# Patient Record
Sex: Female | Born: 1990 | Race: White | Hispanic: No | Marital: Married | Smoking: Never smoker
Health system: Southern US, Community
[De-identification: ages and names within clinical notes are randomized; demographics above are authoritative.]

## PROBLEM LIST (undated history)

## (undated) DIAGNOSIS — F419 Anxiety disorder, unspecified: Secondary | ICD-10-CM

## (undated) DIAGNOSIS — F329 Major depressive disorder, single episode, unspecified: Secondary | ICD-10-CM

## (undated) DIAGNOSIS — T7840XA Allergy, unspecified, initial encounter: Secondary | ICD-10-CM

## (undated) DIAGNOSIS — F32A Depression, unspecified: Secondary | ICD-10-CM

## (undated) HISTORY — DX: Depression, unspecified: F32.A

## (undated) HISTORY — PX: KNEE SURGERY: SHX244

## (undated) HISTORY — DX: Anxiety disorder, unspecified: F41.9

## (undated) HISTORY — DX: Allergy, unspecified, initial encounter: T78.40XA

---
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
## 2011-02-17 ENCOUNTER — Emergency Department (HOSPITAL_COMMUNITY)
Admission: EM | Admit: 2011-02-17 | Discharge: 2011-02-17 | Disposition: A | Discharge status: Home / Self Care | Payer: No Typology Code available for payment source | Attending: Emergency Medicine | Admitting: Emergency Medicine

## 2011-02-17 ENCOUNTER — Emergency Department (HOSPITAL_COMMUNITY): Payer: No Typology Code available for payment source

## 2011-02-17 DIAGNOSIS — Y998 Other external cause status: Secondary | ICD-10-CM | POA: Insufficient documentation

## 2011-02-17 DIAGNOSIS — Y92410 Unspecified street and highway as the place of occurrence of the external cause: Secondary | ICD-10-CM | POA: Insufficient documentation

## 2011-02-17 DIAGNOSIS — S335XXA Sprain of ligaments of lumbar spine, initial encounter: Secondary | ICD-10-CM | POA: Insufficient documentation

## 2011-02-17 DIAGNOSIS — S0990XA Unspecified injury of head, initial encounter: Secondary | ICD-10-CM | POA: Insufficient documentation

## 2011-02-17 MED ORDER — IOHEXOL 300 MG/ML  SOLN
100.0000 mL | Freq: Once | INTRAMUSCULAR | Status: AC | PRN
  Administered 2011-02-17: 100 mL via INTRAVENOUS

## 2011-08-19 ENCOUNTER — Ambulatory Visit (INDEPENDENT_AMBULATORY_CARE_PROVIDER_SITE_OTHER): Payer: BC Managed Care – PPO | Admitting: Physician Assistant

## 2011-08-19 VITALS — BP 115/72 | HR 76 | Temp 97.9°F | Resp 18 | Ht 61.0 in | Wt 139.0 lb

## 2011-08-19 DIAGNOSIS — J301 Allergic rhinitis due to pollen: Secondary | ICD-10-CM

## 2011-08-19 DIAGNOSIS — R05 Cough: Secondary | ICD-10-CM

## 2011-08-19 DIAGNOSIS — J029 Acute pharyngitis, unspecified: Secondary | ICD-10-CM

## 2011-08-19 DIAGNOSIS — J40 Bronchitis, not specified as acute or chronic: Secondary | ICD-10-CM

## 2011-08-19 LAB — POCT RAPID STREP A (OFFICE): Rapid Strep A Screen: NEGATIVE

## 2011-08-19 MED ORDER — FLUTICASONE PROPIONATE 50 MCG/ACT NA SUSP: 2.0000 | Freq: Every day | NASAL | Status: DC

## 2011-08-19 MED ORDER — HYDROCODONE-HOMATROPINE 5-1.5 MG/5ML PO SYRP: ORAL_SOLUTION | ORAL | Status: AC

## 2011-08-19 MED ORDER — IPRATROPIUM BROMIDE 0.06 % NA SOLN: 2.0000 | Freq: Three times a day (TID) | NASAL | Status: DC

## 2011-08-19 MED ORDER — AZITHROMYCIN 250 MG PO TABS: ORAL_TABLET | ORAL | Status: AC

## 2011-08-19 NOTE — Progress Notes (Signed)
Patient ID: Breona Cherubin MRN: [id number], DOB: [birth year], 21 y.o. Date of Encounter: 08/19/2011, 3:10 PM  Primary Physician: No primary provider on file.  Chief Complaint:  Chief Complaint  Patient presents with  . Headache    x 2 days  . Nasal Congestion    x 2 days  . Sore Throat    x 2 days    HPI: 21 y.o. year old female presents with a 4 day history of nasal congestion, post nasal drip, sore throat, sinus pressure, cough, rhinorrhea, and sneezing. Subjective fever. No chills. Nasal congestion thick and green/yellow. Cough is productive of green/yellow sputum and worse in the morning and at night when she lays down. Ears feel full, leading to sensation of muffled hearing. Has tried Allegra without success. No GI complaints. Appetite normal. She typically develops allergies, but has never had them be this bad before.   No sick contacts, recent antibiotics, or recent travels.   No leg trauma, sedentary periods, h/o cancer, or tobacco use.  Past Medical History  Diagnosis Date  . Allergy      Home Meds: Prior to Admission medications   Medication Sig Start Date End Date Taking? Authorizing Provider  fexofenadine-pseudoephedrine (ALLEGRA-D 24) 180-240 MG per 24 hr tablet Take 1 tablet by mouth daily.   Yes Historical Provider, MD    Allergies: No Known Allergies  History   Social History  . Marital Status: Single    Spouse Name: N/A    Number of Children: N/A  . Years of Education: N/A   Occupational History  . Not on file.   Social History Main Topics  . Smoking status: Never Smoker   . Smokeless tobacco: Not on file  . Alcohol Use: Not on file  . Drug Use: Not on file  . Sexually Active: Not on file   Other Topics Concern  . Not on file   Social History Narrative  . No narrative on file     Review of Systems: Constitutional: negative for chills, night sweats or weight changes Cardiovascular: negative for chest pain or  palpitations Respiratory: negative for hemoptysis, wheezing, or shortness of breath Abdominal: negative for abdominal pain, nausea, vomiting or diarrhea Dermatological: negative for rash Neurologic: negative for headache   Physical Exam: Blood pressure 115/72, pulse 76, temperature 97.9 F (36.6 C), temperature source Oral, resp. rate 18, height 5\' 1"  (1.549 m), weight 139 lb (63.05 kg), last menstrual period 07/26/2011., Body mass index is 26.26 kg/(m^2). General: Well developed, well nourished, in no acute distress. Head: Normocephalic, atraumatic, eyes without discharge, sclera non-icteric, nares are congested. Bilateral auditory canals clear, TM's are without perforation, pearly grey with reflective cone of light bilaterally. Serous effusion bilaterally behind TM's. Frontal sinus TTP. Oral cavity moist, dentition normal. Posterior pharynx with post nasal drip and mild erythema. No peritonsillar abscess or tonsillar exudate. Neck: Supple. No thyromegaly. Full ROM. No lymphadenopathy. Lungs: Clear bilaterally to auscultation without wheezes, rales, or rhonchi. Breathing is unlabored.  Heart: RRR with S1 S2. No murmurs, rubs, or gallops appreciated. Msk:  Strength and tone normal for age. Extremities: No clubbing or cyanosis. No edema. Neuro: Alert and oriented X 3. Moves all extremities spontaneously. CNII-XII grossly in tact. Psych:  Responds to questions appropriately with a normal affect.   Labs: Results for orders placed in visit on 08/19/11  POCT RAPID STREP A (OFFICE)      Component Value Range   Rapid Strep A Screen Negative  Negative  TC pending  ASSESSMENT AND PLAN:  21 y.o. year old female with allergic rhinitis and early bronchitis -Azithromycin 250 MG #6 2 po first day then 1 po next 4 days no RF, take if no better in 3 days -Atrovent NS 0.06% 2 sprays each nare bid prn #1 no RF -Flonase 2 sprays each nare daily #1 no RF -Hycodan #4oz 1 tsp po q 4-6 hours prn cough no  RF SED -TC results pending -Mucinex -Tylenol/Motrin prn -Rest/fluids -RTC precautions -RTC 3-5 days if no improvement  Signed, Eula Listen, PA-C 08/19/2011 3:10 PM

## 2011-08-21 LAB — CULTURE, GROUP A STREP: Organism ID, Bacteria: NORMAL

## 2012-02-22 ENCOUNTER — Ambulatory Visit (INDEPENDENT_AMBULATORY_CARE_PROVIDER_SITE_OTHER): Payer: BC Managed Care – PPO | Admitting: Family Medicine

## 2012-02-22 VITALS — BP 108/72 | HR 68 | Temp 98.2°F | Resp 16 | Ht 63.0 in | Wt 140.0 lb

## 2012-02-22 DIAGNOSIS — J029 Acute pharyngitis, unspecified: Secondary | ICD-10-CM

## 2012-02-22 LAB — POCT RAPID STREP A (OFFICE): Rapid Strep A Screen: NEGATIVE

## 2012-02-22 MED ORDER — PENICILLIN V POTASSIUM 500 MG PO TABS: 500.0000 mg | ORAL_TABLET | Freq: Two times a day (BID) | ORAL | Status: DC

## 2012-02-22 NOTE — Progress Notes (Signed)
  Subjective:    Patient ID: Carmen Keller, female    DOB: 1990/06/06, 21 y.o.   MRN: [id number]  HPI  Has a sore throat and it is traveling up to her left ear, hard for her to swallow and feels like food is getting stuck for the past 2d No f/c, has had an occ cough and a few sneezing. Has had night sweats.  Past Medical History  Diagnosis Date  . Allergy      Review of Systems  Constitutional: Positive for diaphoresis. Negative for fever, chills and fatigue.  HENT: Positive for sore throat, sneezing, trouble swallowing, neck pain and voice change. Negative for congestion, rhinorrhea, drooling and postnasal drip.   Eyes: Negative for discharge.  Respiratory: Positive for cough. Negative for shortness of breath.   Cardiovascular: Negative for chest pain.  Gastrointestinal: Negative for nausea and vomiting.  Skin: Negative for color change, pallor and rash.  Hematological: Positive for adenopathy.        BP 108/72  Pulse 68  Temp 98.2 F (36.8 C) (Oral)  Resp 16  Ht 5\' 3"  (1.6 m)  Wt 140 lb (63.504 kg)  BMI 24.80 kg/m2  SpO2 98%  LMP 01/30/2012  Objective:   Physical Exam  Constitutional: She is oriented to person, place, and time. She appears well-developed and well-nourished. No distress.  HENT:  Head: Normocephalic and atraumatic.  Right Ear: Tympanic membrane, external ear and ear canal normal.  Left Ear: Tympanic membrane, external ear and ear canal normal.  Nose: Rhinorrhea present.  Mouth/Throat: Uvula is midline and mucous membranes are normal. Oropharyngeal exudate, posterior oropharyngeal edema and posterior oropharyngeal erythema present. No tonsillar abscesses.  Eyes: Conjunctivae normal are normal. Right eye exhibits no discharge. Left eye exhibits no discharge. No scleral icterus.  Neck: Normal range of motion. Neck supple. No thyromegaly present.  Cardiovascular: Normal rate, regular rhythm and normal heart sounds.   Pulmonary/Chest: Effort normal and  breath sounds normal. No respiratory distress.  Lymphadenopathy:       Head (right side): Submandibular adenopathy present.       Head (left side): Submandibular adenopathy present.    She has cervical adenopathy.       Right cervical: Superficial cervical adenopathy present.       Left cervical: Superficial cervical adenopathy present.  Neurological: She is alert and oriented to person, place, and time.  Skin: Skin is warm and dry. She is not diaphoretic.  Psychiatric: She has a normal mood and affect. Her behavior is normal.      Results for orders placed in visit on 02/22/12  POCT RAPID STREP A (OFFICE)      Component Value Range   Rapid Strep A Screen Negative  Negative    Assessment & Plan:  1. Pharyngitis - Due to hx and current exam, will go ahead and cover w/ penicillin.   clx pending.  If positive - pt may want to go back to ENT to consider tonsillectomy.

## 2012-02-24 LAB — CULTURE, GROUP A STREP: Organism ID, Bacteria: NORMAL

## 2012-04-28 ENCOUNTER — Ambulatory Visit (INDEPENDENT_AMBULATORY_CARE_PROVIDER_SITE_OTHER): Payer: BC Managed Care – PPO | Admitting: Family Medicine

## 2012-04-28 VITALS — BP 112/72 | HR 64 | Temp 97.7°F | Resp 16 | Ht 63.0 in | Wt 142.0 lb

## 2012-04-28 DIAGNOSIS — N390 Urinary tract infection, site not specified: Secondary | ICD-10-CM

## 2012-04-28 DIAGNOSIS — R319 Hematuria, unspecified: Secondary | ICD-10-CM

## 2012-04-28 DIAGNOSIS — R300 Dysuria: Secondary | ICD-10-CM

## 2012-04-28 DIAGNOSIS — R339 Retention of urine, unspecified: Secondary | ICD-10-CM

## 2012-04-28 LAB — POCT UA - MICROSCOPIC ONLY
Casts, Ur, LPF, POC: NEGATIVE
Crystals, Ur, HPF, POC: NEGATIVE
Mucus, UA: POSITIVE
Yeast, UA: NEGATIVE

## 2012-04-28 LAB — POCT URINALYSIS DIPSTICK
Bilirubin, UA: NEGATIVE
Glucose, UA: NEGATIVE
Ketones, UA: NEGATIVE
Nitrite, UA: NEGATIVE
Protein, UA: 100
Spec Grav, UA: >=1.03
Urobilinogen, UA: 0.2
pH, UA: 6

## 2012-04-28 MED ORDER — PHENAZOPYRIDINE HCL 100 MG PO TABS: 100.0000 mg | ORAL_TABLET | Freq: Three times a day (TID) | ORAL | Status: DC | PRN

## 2012-04-28 MED ORDER — SULFAMETHOXAZOLE-TRIMETHOPRIM 800-160 MG PO TABS: 1.0000 | ORAL_TABLET | Freq: Two times a day (BID) | ORAL | Status: DC

## 2012-04-28 NOTE — Progress Notes (Signed)
Subjective: 21 year old female who has not had a lot of problems with UTIs in the past. For the last 3 days she's been having dysuria. Her urine was initially dark. This morning there is some blood in it. She's had a urgency sensation but not urinating much. Not febrile.  Objective: Afebrile. No CVA tenderness.  Results for orders placed in visit on 04/28/12  POCT URINALYSIS DIPSTICK      Component Value Range   Color, UA yellow     Clarity, UA cloudy     Glucose, UA neg     Bilirubin, UA neg     Ketones, UA neg     Spec Grav, UA >=1.030     Blood, UA large     pH, UA 6.0     Protein, UA 100     Urobilinogen, UA 0.2     Nitrite, UA neg     Leukocytes, UA moderate (2+)    POCT UA - MICROSCOPIC ONLY      Component Value Range   WBC, Ur, HPF, POC tntc     RBC, urine, microscopic tntc     Bacteria, U Microscopic 4+     Mucus, UA pos     Epithelial cells, urine per micros 4-6     Crystals, Ur, HPF, POC neg     Casts, Ur, LPF, POC neg     Yeast, UA neg     Assessment: UTI  Plan: Bactrim Peridium Return if further problems Cultures pending

## 2012-04-28 NOTE — Patient Instructions (Signed)
Urinary Tract Infection Urinary tract infections (UTIs) can develop anywhere along your urinary tract. Your urinary tract is your body's drainage system for removing wastes and extra water. Your urinary tract includes two kidneys, two ureters, a bladder, and a urethra. Your kidneys are a pair of bean-shaped organs. Each kidney is about the size of your fist. They are located below your ribs, one on each side of your spine. CAUSES Infections are caused by microbes, which are microscopic organisms, including fungi, viruses, and bacteria. These organisms are so small that they can only be seen through a microscope. Bacteria are the microbes that most commonly cause UTIs. SYMPTOMS  Symptoms of UTIs may vary by age and gender of the patient and by the location of the infection. Symptoms in young women typically include a frequent and intense urge to urinate and a painful, burning feeling in the bladder or urethra during urination. Older women and men are more likely to be tired, shaky, and weak and have muscle aches and abdominal pain. A fever may mean the infection is in your kidneys. Other symptoms of a kidney infection include pain in your back or sides below the ribs, nausea, and vomiting. DIAGNOSIS To diagnose a UTI, your caregiver will ask you about your symptoms. Your caregiver also will ask to provide a urine sample. The urine sample will be tested for bacteria and white blood cells. White blood cells are made by your body to help fight infection. TREATMENT  Typically, UTIs can be treated with medication. Because most UTIs are caused by a bacterial infection, they usually can be treated with the use of antibiotics. The choice of antibiotic and length of treatment depend on your symptoms and the type of bacteria causing your infection. HOME CARE INSTRUCTIONS  If you were prescribed antibiotics, take them exactly as your caregiver instructs you. Finish the medication even if you feel better after you  have only taken some of the medication.  Drink enough water and fluids to keep your urine clear or pale yellow.  Avoid caffeine, tea, and carbonated beverages. They tend to irritate your bladder.  Empty your bladder often. Avoid holding urine for long periods of time.  Empty your bladder before and after sexual intercourse.  After a bowel movement, women should cleanse from front to back. Use each tissue only once. SEEK MEDICAL CARE IF:   You have back pain.  You develop a fever.  Your symptoms do not begin to resolve within 3 days. SEEK IMMEDIATE MEDICAL CARE IF:   You have severe back pain or lower abdominal pain.  You develop chills.  You have nausea or vomiting.  You have continued burning or discomfort with urination. MAKE SURE YOU:   Understand these instructions.  Will watch your condition.  Will get help right away if you are not doing well or get worse. Document Released: 02/03/2005 Document Revised: 10/26/2011 Document Reviewed: 06/04/2011 [employer] Patient Information 2013 [employer].  

## 2012-05-01 LAB — URINE CULTURE: Colony Count: >=100000

## 2012-05-14 ENCOUNTER — Emergency Department (HOSPITAL_COMMUNITY)
Admission: EM | Admit: 2012-05-14 | Discharge: 2012-05-14 | Disposition: A | Discharge status: Home / Self Care | Payer: BC Managed Care – PPO | Attending: Emergency Medicine | Admitting: Emergency Medicine

## 2012-05-14 ENCOUNTER — Encounter (HOSPITAL_COMMUNITY): Payer: Self-pay | Admitting: Emergency Medicine

## 2012-05-14 DIAGNOSIS — Z3202 Encounter for pregnancy test, result negative: Secondary | ICD-10-CM | POA: Insufficient documentation

## 2012-05-14 DIAGNOSIS — R110 Nausea: Secondary | ICD-10-CM | POA: Insufficient documentation

## 2012-05-14 DIAGNOSIS — R350 Frequency of micturition: Secondary | ICD-10-CM | POA: Insufficient documentation

## 2012-05-14 DIAGNOSIS — N390 Urinary tract infection, site not specified: Secondary | ICD-10-CM | POA: Insufficient documentation

## 2012-05-14 DIAGNOSIS — R6883 Chills (without fever): Secondary | ICD-10-CM | POA: Insufficient documentation

## 2012-05-14 DIAGNOSIS — R319 Hematuria, unspecified: Secondary | ICD-10-CM | POA: Insufficient documentation

## 2012-05-14 LAB — URINE MICROSCOPIC-ADD ON

## 2012-05-14 LAB — URINALYSIS, ROUTINE W REFLEX MICROSCOPIC
Bilirubin Urine: NEGATIVE
Glucose, UA: NEGATIVE mg/dL
Hgb urine dipstick: NEGATIVE
Nitrite: POSITIVE — AB
Protein, ur: 30 mg/dL — AB
Specific Gravity, Urine: 1.029 (ref 1.005–1.030)
Urobilinogen, UA: 1 mg/dL (ref 0.0–1.0)
pH: 7 (ref 5.0–8.0)

## 2012-05-14 LAB — POCT PREGNANCY, URINE: Preg Test, Ur: NEGATIVE

## 2012-05-14 MED ORDER — NITROFURANTOIN MONOHYD MACRO 100 MG PO CAPS: 100.0000 mg | ORAL_CAPSULE | Freq: Two times a day (BID) | ORAL | Status: DC

## 2012-05-14 MED ORDER — IBUPROFEN 800 MG PO TABS
800.0000 mg | ORAL_TABLET | Freq: Once | ORAL | Status: AC
  Administered 2012-05-14: 800 mg via ORAL
  Filled 2012-05-14: qty 1

## 2012-05-14 NOTE — ED Provider Notes (Signed)
History     CSN: [id number]  Arrival date & time 05/14/12  2055   First MD Initiated Contact with Patient 05/14/12 2117      Chief Complaint  Patient presents with  . Dysuria   HPI  History provided by the patient. Patient is 22 year old female with no significant PMH who presents with complaints of dysuria, urinary frequency for the past week. Patient states she is sexually had some symptoms for the past 2 weeks or more. She was seen in urgent care and she states at that time was told that she had a urinary tract infection. She was given prescriptions for primary and which she has been taking but found out this was not an antibiotic and states that she continues to have symptoms. Symptoms have been associated with some chills and nausea. She denies any fever or vomiting.    Past Medical History  Diagnosis Date  . Allergy     Past Surgical History  Procedure Date  . Knee surgery     Family History  Problem Relation Age of Onset  . Diabetes Father   . Cancer Maternal Grandfather   . Diabetes Paternal Grandfather     History  Substance Use Topics  . Smoking status: Never Smoker   . Smokeless tobacco: Not on file  . Alcohol Use: No    OB History    Grav Para Term Preterm Abortions TAB SAB Ect Mult Living                  Review of Systems  Constitutional: Positive for chills. Negative for fever.  Respiratory: Negative for cough.   Gastrointestinal: Positive for nausea. Negative for vomiting and abdominal pain.  Genitourinary: Positive for dysuria, frequency and hematuria. Negative for flank pain.  All other systems reviewed and are negative.    Allergies  Review of patient's allergies indicates no known allergies.  Home Medications   Current Outpatient Rx  Name  Route  Sig  Dispense  Refill  . PHENAZOPYRIDINE HCL 100 MG PO TABS   Oral   Take 1 tablet (100 mg total) by mouth 3 (three) times daily as needed for pain.   10 tablet   0     BP 132/88   Pulse 83  Temp 98.5 F (36.9 C) (Oral)  Resp 20  SpO2 100%  LMP 05/12/2012  Physical Exam  Nursing note and vitals reviewed. Constitutional: She is oriented to person, place, and time. She appears well-developed and well-nourished. No distress.  HENT:  Head: Normocephalic.  Cardiovascular: Normal rate and regular rhythm.   Pulmonary/Chest: Effort normal and breath sounds normal. No respiratory distress. She has no wheezes. She has no rales.  Abdominal: Soft. She exhibits no distension. There is no guarding.       Mild diffuse tenderness. No significant CVA tenderness.  Neurological: She is alert and oriented to person, place, and time.  Skin: Skin is warm and dry. No rash noted.  Psychiatric: She has a normal mood and affect. Her behavior is normal.    ED Course  Procedures   Results for orders placed during the hospital encounter of 05/14/12  URINALYSIS, ROUTINE W REFLEX MICROSCOPIC      Component Value Range   Color, Urine ORANGE (*) YELLOW   APPearance CLOUDY (*) CLEAR   Specific Gravity, Urine 1.029  1.005 - 1.030   pH 7.0  5.0 - 8.0   Glucose, UA NEGATIVE  NEGATIVE mg/dL   Hgb urine dipstick NEGATIVE  NEGATIVE   Bilirubin Urine NEGATIVE  NEGATIVE   Ketones, ur TRACE (*) NEGATIVE mg/dL   Protein, ur 30 (*) NEGATIVE mg/dL   Urobilinogen, UA 1.0  0.0 - 1.0 mg/dL   Nitrite POSITIVE (*) NEGATIVE   Leukocytes, UA MODERATE (*) NEGATIVE  POCT PREGNANCY, URINE      Component Value Range   Preg Test, Ur NEGATIVE  NEGATIVE  URINE MICROSCOPIC-ADD ON      Component Value Range   Squamous Epithelial / LPF RARE  RARE   WBC, UA 7-10  <3 WBC/hpf   Bacteria, UA RARE  RARE   Urine-Other AMORPHOUS URATES/PHOSPHATES          1. UTI (lower urinary tract infection)       MDM  10:10PM patient seen and evaluated. Patient is well-appearing no acute distress. Patient does not appear severely ill or toxic       Angus Seller, [place] 05/14/12 2228

## 2012-05-14 NOTE — ED Notes (Signed)
Did not collect urine specimen in triage as pt is on her menstrual cycle

## 2012-05-14 NOTE — ED Notes (Signed)
Pt states she has sxs of UTI  Pt was seen at urgent care and was given pyridium but no antibiotics  Pt states she has been using azo  Pt states she is having frequency, painful urination, and states her stream is not like it should be it comes in spurts

## 2012-05-15 NOTE — ED Provider Notes (Signed)
Medical screening examination/treatment/procedure(s) were performed by non-physician practitioner and as supervising physician I was immediately available for consultation/collaboration.  Toy Baker, MD 05/15/12 [phone]

## 2012-11-06 ENCOUNTER — Ambulatory Visit (INDEPENDENT_AMBULATORY_CARE_PROVIDER_SITE_OTHER): Payer: BC Managed Care – PPO | Admitting: Emergency Medicine

## 2012-11-06 VITALS — BP 122/80 | HR 76 | Temp 98.5°F | Resp 16 | Ht 62.0 in | Wt 138.8 lb

## 2012-11-06 DIAGNOSIS — A09 Infectious gastroenteritis and colitis, unspecified: Secondary | ICD-10-CM

## 2012-11-06 LAB — COMPREHENSIVE METABOLIC PANEL
ALT: 123 U/L — ABNORMAL HIGH (ref 0–35)
AST: 68 U/L — ABNORMAL HIGH (ref 0–37)
Albumin: 4.6 g/dL (ref 3.5–5.2)
Alkaline Phosphatase: 74 U/L (ref 39–117)
BUN: 10 mg/dL (ref 6–23)
CO2: 27 mEq/L (ref 19–32)
Calcium: 10.3 mg/dL (ref 8.4–10.5)
Chloride: 103 mEq/L (ref 96–112)
Creat: 0.87 mg/dL (ref 0.50–1.10)
Glucose, Bld: 99 mg/dL (ref 70–99)
Potassium: 4.4 mEq/L (ref 3.5–5.3)
Sodium: 140 mEq/L (ref 135–145)
Total Bilirubin: 0.3 mg/dL (ref 0.3–1.2)
Total Protein: 7.6 g/dL (ref 6.0–8.3)

## 2012-11-06 MED ORDER — CIPROFLOXACIN HCL 750 MG PO TABS: 750.0000 mg | ORAL_TABLET | Freq: Two times a day (BID) | ORAL | Status: DC

## 2012-11-06 NOTE — Progress Notes (Signed)
[facility] [address] [place] [address] [phone]  Date:  11/06/2012   Name:  Carmen Keller   DOB:  [birth year]   MRN:  [id number]  PCP:  No primary provider on file.    Chief Complaint: nausea and vomiting and loss of appetitie   History of Present Illness:  Carmen Keller is a 22 y.o. very pleasant female patient who presents with the following:  Visited [place] on vacation and has experienced frequent diarrhea and nausea.  Now has clay colored stool.  No jaundice.  Has fever and chills. No rash.  No improvement with over the counter medications or other home remedies. Denies other complaint or health concern today.   There are no active problems to display for this patient.   Past Medical History  Diagnosis Date  . Allergy     Past Surgical History  Procedure Laterality Date  . Knee surgery      History  Substance Use Topics  . Smoking status: Never Smoker   . Smokeless tobacco: Not on file  . Alcohol Use: No    Family History  Problem Relation Age of Onset  . Diabetes Father   . Cancer Maternal Grandfather   . Diabetes Paternal Grandfather     No Known Allergies  Medication list has been reviewed and updated.  Current Outpatient Prescriptions on File Prior to Visit  Medication Sig Dispense Refill  . nitrofurantoin, macrocrystal-monohydrate, (MACROBID) 100 MG capsule Take 1 capsule (100 mg total) by mouth 2 (two) times daily. X 7 days  14 capsule  0  . phenazopyridine (PYRIDIUM) 100 MG tablet Take 1 tablet (100 mg total) by mouth 3 (three) times daily as needed for pain.  10 tablet  0   No current facility-administered medications on file prior to visit.    Review of Systems:  As per HPI, otherwise negative.    Physical Examination: Filed Vitals:   11/06/12 1345  BP: 122/80  Pulse: 76  Temp: 98.5 F (36.9 C)  Resp: 16   Filed Vitals:   11/06/12 1345  Height: 5\' 2"  (1.575 m)  Weight: 138 lb 12.8 oz  (62.959 kg)   Body mass index is 25.38 kg/(m^2). Ideal Body Weight: Weight in (lb) to have BMI = 25: 136.4  GEN: WDWN, NAD, Non-toxic, A & O x 3 HEENT: Atraumatic, Normocephalic. Neck supple. No masses, No LAD. Ears and Nose: No external deformity. CV: RRR, No M/G/R. No JVD. No thrill. No extra heart sounds. PULM: CTA B, no wheezes, crackles, rhonchi. No retractions. No resp. distress. No accessory muscle use. ABD: S, NT, ND, +BS. No rebound. No HSM. EXTR: No c/c/e NEURO Normal gait.  PSYCH: Normally interactive. Conversant. Not depressed or anxious appearing.  Calm demeanor.    Assessment and Plan: Travelers diarrhea Possible hepatitis Labs cipro   Signed,  Phillips Odor, MD

## 2012-11-06 NOTE — Patient Instructions (Addendum)
Travelers' Diarrhea Travelers' diarrhea (TD) is the most common illness affecting travelers. Each year many travelers develop diarrhea. TD usually occurs within the first week of travel. However, it may occur at any time while traveling. It may even occur after returning home. The most important risk factor is where you are going. High-risk places are the developing countries of:  [place].  [place].  [place]. High risk people include young adults and those with:  Transplants.  HIV infections.  Medicine that suppresses the immune system.  Inflammatory-bowel disease.  Diabetes.  H-2 blockers or antacids. Attack rates are similar for men and women. The primary source of TD is eating or drinking food or water tainted with feces (stool or bowel movements). CAUSES  Infectious agents are the primary cause of TD. Germs cause almost 80% of TD cases. The most common germ produces:  Watery diarrhea with cramps.  Low-grade or no fever. There are many other bacterial, viral and parasitic pathogens (disease causing "bugs").  SYMPTOMS  Most TD cases begin suddenly. Symptoms include stool that is increased in:  Frequency.  Volume.  Weight. Altered stool consistency also is common. Typically, you have four to five loose or watery bowel movements each day. Other common symptoms are:  Nausea.  Vomiting.  Diarrhea.  Abdominal cramping.  Bloating.  Fever.  Urgency.  Malaise. Most cases are not dangerous. Most cases go away in 1-2 days without treatment. TD is rarely life threatening. 90% of cases resolve within 1 week. 98% resolve within 1 month. PREVENTION   Avoid foods or beverages purchased from street vendors in high risk countries.  Avoid food from places where unclean conditions are present.  Avoid raw or undercooked meat and seafood.  Avoid raw fruits (e.g., oranges, bananas, avocados) and vegetables unless you peel them yourself.  If handled  properly, well-cooked and packaged foods usually are safe. Foods associated with increased risk for TD include:  Tap water.  Ice.  Unpasteurized milk.  Dairy products.  Safe beverages include:  Bottled carbonated beverages.  Hot tea or coffee.  Beer.  Wine.  Boiled water.  Water treated with iodine or chlorine. ANTIBIOTICS ARE NOT RECOMMENDED AS PREVENTION  CDC (Centers for Disease Control) does not recommend antimicrobial drugs (medicine that kill germs) to prevent TD. Several studies show that Pepto-Bismol taken as either 2 tablets 4 times daily, or 2 fluid ounces 4 times daily, reduces the incidence of travelers' diarrhea. People that should avoid Pepto-Bismol include those who are:  Pregnant.  Allergic to aspirin.  Taking anticoagulants medicine (probenecid, methotrexate).  Be informed about potential side effects, in particular about temporary blackening of the tongue and stool, and rarely ringing in the ears. Because of potential adverse side effects, preventative Pepto-Bismol should not be used for more than 3 weeks.  Some antibiotics taken in a once-a-day dose are 90% effective at preventing travelers' diarrhea. However, antibiotics are not recommended as prevention. Routine antimicrobial prophylaxis increases your risk for:  Adverse reactions.  Infections with resistant organisms.  Antibiotics can increase your susceptibility to resistant bacterial pathogens and provide no protection against either viral or parasitic pathogens. This can give travelers a false sense of security. As a result, strict adherence to preventive measures is encouraged. Pepto-Bismol should be used as an extra effort if prophylaxis is needed. TREATMENT   TD usually is a self-limited disorder. It gets well without treatment. It often goes away without specific treatment. Oral re-hydration is often helpful to replace lost fluids  and electrolytes. Clear liquids are routinely recommended  for adults. You may be helped with antimicrobial therapy if you develop three or more loose stools in an 8-hour period, especially if associated with:  Nausea.  Vomiting.  Abdominal cramps.  Fever.  Blood in stools.  Antibiotics usually are given for 3-5 days. Pepto-Bismol also may be used as treatment. Take one fluid ounce, or two 262 mg tablets every 30 minutes, for up to 8 doses in a 24-hour period. This can be repeated on a second day. If diarrhea persists despite therapy, you should be evaluated by a caregiver and treated for possible parasitic infection.  Because drug resistance is a continuing problem and may vary from country to country, professional assistance should be looked for if problems persist.  Antimotility agents (loperamide, diphenoxylate, and paregoric) mostly reduce diarrhea by slowing down the passage of food and drink in the gut. This allows more time for absorption. Some persons believe diarrhea is the body's defense mechanism to minimize contact time between gut pathogens and lining of the bowel. In several studies, antimotility agents have been useful in treating travelers' diarrhea by decreasing the duration of diarrhea. However, these agents should never be used by persons with fever or bloody diarrhea because they can increase the severity of disease by delaying clearance of causative organisms. Because antimotility agents are now available over the counter, their improper use is of concern. Complications have been reported from the use of these medicines such as:  Toxic megacolon.  Sepsis.  Disseminated intravascular coagulation. SEEK IMMEDIATE MEDICAL CARE IF:   You are unable to keep fluids down.  Vomiting or diarrhea becomes persistent.  Abdominal (belly) pain develops or increases or localizes. (Right sided pain can be appendicitis and left sided pain in adults can be diverticulitis).  You develop an oral temperature above 102 F (38.9 C), or as your  caregiver suggests.  Diarrhea becomes excessive or contains blood or mucous.  Excessive weakness, dizziness, fainting or extreme thirst.  Checking weight 2 to 3 times per day in babies and children will help verify adequate fluid replacement. Your caregiver will tell you what loss should concern you or suggest another visit to your personal physician.  Record your weight or your child's weight today. Compare this to your home scale and record all weights and time and date weighed. Try to check weight at the same times every day. Bring this chart to your caregivers if you or your child needs to be seen again. FOR MORE INFORMATION  Travelers should consult with a caregiver before departing on a trip abroad. Information about TD is available from:  Your local or state health departments.  World Science writer [facility]). Other information that may be of interest to travelers can be found at the [facility] Travelers' Health homepage at [email or web address]. Document Released: 04/16/2002 Document Revised: 07/19/2011 Document Reviewed: 07/04/2008 [facility] Patient Information 2014 [place], [place].

## 2012-11-07 LAB — ACUTE HEP PANEL AND HEP B SURFACE AB
HCV Ab: NEGATIVE
Hep A IgM: NEGATIVE
Hep B C IgM: NEGATIVE
Hep B S Ab: REACTIVE — AB
Hepatitis B Surface Ag: NEGATIVE

## 2014-05-06 ENCOUNTER — Ambulatory Visit: Payer: Self-pay | Admitting: Family Medicine

## 2014-05-06 LAB — URINALYSIS, COMPLETE
Bilirubin,UR: NEGATIVE
Blood: NEGATIVE
Glucose,UR: NEGATIVE
Ketone: NEGATIVE
Nitrite: NEGATIVE
Ph: 6 (ref 5.0–8.0)
Protein: NEGATIVE
RBC,UR: NONE SEEN /HPF (ref 0–5)
Specific Gravity: 1.025 (ref 1.000–1.030)

## 2014-05-06 LAB — WET PREP, GENITAL

## 2014-05-07 LAB — GC/CHLAMYDIA PROBE AMP

## 2014-05-08 LAB — URINE CULTURE

## 2016-08-07 ENCOUNTER — Emergency Department
Admission: EM | Admit: 2016-08-07 | Discharge: 2016-08-07 | Disposition: A | Discharge status: Home / Self Care | Payer: BLUE CROSS/BLUE SHIELD | Attending: Emergency Medicine | Admitting: Emergency Medicine

## 2016-08-07 ENCOUNTER — Emergency Department: Payer: BLUE CROSS/BLUE SHIELD

## 2016-08-07 ENCOUNTER — Encounter: Payer: Self-pay | Admitting: Emergency Medicine

## 2016-08-07 DIAGNOSIS — Y999 Unspecified external cause status: Secondary | ICD-10-CM | POA: Insufficient documentation

## 2016-08-07 DIAGNOSIS — S99912A Unspecified injury of left ankle, initial encounter: Secondary | ICD-10-CM | POA: Diagnosis present

## 2016-08-07 DIAGNOSIS — S93492A Sprain of other ligament of left ankle, initial encounter: Secondary | ICD-10-CM

## 2016-08-07 DIAGNOSIS — Y92017 Garden or yard in single-family (private) house as the place of occurrence of the external cause: Secondary | ICD-10-CM | POA: Insufficient documentation

## 2016-08-07 DIAGNOSIS — X501XXA Overexertion from prolonged static or awkward postures, initial encounter: Secondary | ICD-10-CM | POA: Diagnosis not present

## 2016-08-07 DIAGNOSIS — Y9389 Activity, other specified: Secondary | ICD-10-CM | POA: Insufficient documentation

## 2016-08-07 MED ORDER — MELOXICAM 15 MG PO TABS: 15.0000 mg | ORAL_TABLET | Freq: Every day | ORAL | 0 refills | Status: DC

## 2016-08-07 NOTE — ED Provider Notes (Signed)
[facility] Emergency Department Provider Note  ____________________________________________  Time seen: Approximately 11:01 PM  I have reviewed the triage vital signs and the nursing notes.   HISTORY  Chief Complaint Foot Pain    HPI Carmen Keller is a 26 y.o. female who presents emergency department complaining of left ankle and foot pain. Patient reports that she was at a friend's house that was recently built. The yard was uneven and she stepped awkwardly on a rock inverting her ankle. Patient is having pain to the anterolateral aspect of ankle and foot. Patient states that immediately afterward she was unable to bear weight on the ankle. She reports swelling to region of pain. No other injury or complaint. No medications prior to arrival. She denies any numbness or tingling to the toes.   Past Medical History:  Diagnosis Date  . Allergy     There are no active problems to display for this patient.   Past Surgical History:  Procedure Laterality Date  . KNEE SURGERY      Prior to Admission medications   Medication Sig Start Date End Date Taking? Authorizing Provider  ciprofloxacin (CIPRO) 750 MG tablet Take 1 tablet (750 mg total) by mouth 2 (two) times daily. 11/06/12   Carmelina Dane, MD  meloxicam (MOBIC) 15 MG tablet Take 1 tablet (15 mg total) by mouth daily. 08/07/16   Delorise Royals Decorey Wahlert, PA-C  nitrofurantoin, macrocrystal-monohydrate, (MACROBID) 100 MG capsule Take 1 capsule (100 mg total) by mouth 2 (two) times daily. X 7 days 05/14/12   Ivonne Andrew, PA-C  phenazopyridine (PYRIDIUM) 100 MG tablet Take 1 tablet (100 mg total) by mouth 3 (three) times daily as needed for pain. 04/28/12   Peyton Najjar, MD    Allergies Patient has no known allergies.  Family History  Problem Relation Age of Onset  . Diabetes Father   . Cancer Maternal Grandfather   . Diabetes Paternal Grandfather     Social History Social History   Substance Use Topics  . Smoking status: Never Smoker  . Smokeless tobacco: Not on file  . Alcohol use No     Review of Systems  Constitutional: No fever/chills Cardiovascular: no chest pain. Respiratory: no cough. No SOB. Gastrointestinal: No abdominal pain.  No nausea, no vomiting.  No diarrhea.  No constipation. Musculoskeletal: Positive for left ankle and foot pain Skin: Negative for rash, abrasions, lacerations, ecchymosis. Neurological: Negative for headaches, focal weakness or numbness. 10-point ROS otherwise negative.  ____________________________________________   PHYSICAL EXAM:  VITAL SIGNS: ED Triage Vitals [08/07/16 2157]  Enc Vitals Group     BP      Pulse      Resp      Temp      Temp src      SpO2      Weight 170 lb (77.1 kg)     Height  (1.575 m)     Head Circumference      Peak Flow      Pain Score 9     Pain Loc      Pain Edu?      Excl. in GC?      Constitutional: Alert and oriented. Well appearing and in no acute distress. Eyes: Conjunctivae are normal. PERRL. EOMI. Head: Atraumatic. Cardiovascular: Normal rate, regular rhythm. Normal S1 and S2.  Good peripheral circulation. Respiratory: Normal respiratory effort without tachypnea or retractions. Lungs CTAB. Good air entry to the bases with no decreased or absent  breath sounds. Musculoskeletal: Full range of motion to all extremities. No gross deformities appreciated.No gross deformities or edema noted to the left ankle. Minimal edema is noted in the anterolateral plain. Patient is able to move your ankle appropriately with coaxing. Patient is very tender to palpation along the talonavicular joint line. No palpable abnormality. Dorsalis pedis pulse intact. Sensation intact 5 digits. Neurologic:  Normal speech and language. No gross focal neurologic deficits are appreciated.  Skin:  Skin is warm, dry and intact. No rash noted. Psychiatric: Mood and affect are normal. Speech and behavior are  normal. Patient exhibits appropriate insight and judgement.   ____________________________________________   LABS (all labs ordered are listed, but only abnormal results are displayed)  Labs Reviewed - No data to display ____________________________________________  EKG   ____________________________________________  RADIOLOGY Festus Barren Micheal Sheen, personally viewed and evaluated these images (plain radiographs) as part of my medical decision making, as well as reviewing the written report by the radiologist.  Dg Foot Complete Left  Result Date: 08/07/2016 CLINICAL DATA:  Left lateral foot pain after injury EXAM: LEFT FOOT - COMPLETE 3+ VIEW COMPARISON:  None. FINDINGS: There is no evidence of fracture or dislocation. There is no evidence of arthropathy or other focal bone abnormality. Soft tissues are unremarkable. IMPRESSION: Negative. Electronically Signed   By: Delbert Phenix M.D.   On: 08/07/2016 22:50    ____________________________________________    PROCEDURES  Procedure(s) performed:    Procedures    Medications - No data to display   ____________________________________________   INITIAL IMPRESSION / ASSESSMENT AND PLAN / ED COURSE  Pertinent labs & imaging results that were available during my care of the patient were reviewed by me and considered in my medical decision making (see chart for details).  Review of the [place] CSRS was performed in accordance of the [facility] prior to dispensing any controlled drugs.     Patient's diagnosis is consistent with left ankle sprain. X-ray reveals no acute osseous abdomen benign. Exam is reassuring. Patient is given Ace bandage and crutches and emergency department.. Patient will be discharged home with prescriptions for anti-inflammatories for symptom control. Patient is to follow up with orthopedics as needed or otherwise directed. Patient is given ED precautions to return to the ED for any worsening or new  symptoms.     ____________________________________________  FINAL CLINICAL IMPRESSION(S) / ED DIAGNOSES  Final diagnoses:  Sprain of anterior talofibular ligament of left ankle, initial encounter      NEW MEDICATIONS STARTED DURING THIS VISIT:  Discharge Medication List as of 08/07/2016 11:11 PM    START taking these medications   Details  meloxicam (MOBIC) 15 MG tablet Take 1 tablet (15 mg total) by mouth daily., Starting Sat 08/07/2016, Print            This chart was dictated using voice recognition software/Dragon. Despite best efforts to proofread, errors can occur which can change the meaning. Any change was purely unintentional.    Racheal Patches, PA-C 08/07/16 1308    Merrily Brittle, MD 08/07/16 [phone]

## 2016-08-07 NOTE — ED Triage Notes (Signed)
Pt to triage via wheelchair. Pt reports she stepped off of steps on to a rock and twisted her left foot pain to the lateral side of foot. CMS intact.

## 2018-02-01 ENCOUNTER — Other Ambulatory Visit: Payer: Self-pay | Admitting: Family Medicine

## 2018-02-01 ENCOUNTER — Ambulatory Visit
Admission: RE | Admit: 2018-02-01 | Discharge: 2018-02-01 | Disposition: A | Discharge status: Home / Self Care | Payer: BC Managed Care – PPO | Source: Ambulatory Visit | Attending: Family Medicine | Admitting: Family Medicine

## 2018-02-01 DIAGNOSIS — W100XXA Fall (on)(from) escalator, initial encounter: Secondary | ICD-10-CM

## 2018-02-01 DIAGNOSIS — R52 Pain, unspecified: Secondary | ICD-10-CM

## 2018-11-23 DIAGNOSIS — B009 Herpesviral infection, unspecified: Secondary | ICD-10-CM | POA: Insufficient documentation

## 2019-05-11 NOTE — L&D Delivery Note (Signed)
Delivery Note At 9:39 AM a viable female was delivered via Vaginal, Spontaneous (Presentation: Right Occiput Posterior).  APGAR: 8, 9; weight pending.   Placenta status: Spontaneous, Intact.  Cord: 3 vessels with the following complications: None.  Cord pH: n/a  Anesthesia: None Episiotomy: None Lacerations: 2nd degree;Vaginal;Perineal;Labial Suture Repair: 3.0 vicryl Qnt. Blood Loss (mL):  Pending (EBL ~ 500 mL)  Mom to postpartum.  Baby to Couplet care / Skin to Skin.  Called to see patient.  Mom pushed to deliver a viable female infant.  The head followed by shoulders, which delivered without difficulty, and the rest of the body.  No nuchal cord noted.  Baby to mom's chest.  Cord clamped and cut after > 1 min delay.  No cord blood obtained.  Placenta delivered spontaneously, intact, with a 3-vessel cord.  Second degree perineal laceration repaired with 3-0 Vicryl in standard fashion.  All counts correct.  Hemostasis obtained with IV pitocin and fundal massage. EBL 500 mL.      Thomasene Mohair, MD 12/25/2019, 10:25 AM

## 2019-06-22 ENCOUNTER — Ambulatory Visit (INDEPENDENT_AMBULATORY_CARE_PROVIDER_SITE_OTHER): Payer: BC Managed Care – PPO | Admitting: Obstetrics & Gynecology

## 2019-06-22 ENCOUNTER — Other Ambulatory Visit (HOSPITAL_COMMUNITY)
Admission: RE | Admit: 2019-06-22 | Discharge: 2019-06-22 | Disposition: A | Discharge status: Home / Self Care | Payer: BC Managed Care – PPO | Source: Ambulatory Visit | Attending: Obstetrics & Gynecology | Admitting: Obstetrics & Gynecology

## 2019-06-22 ENCOUNTER — Other Ambulatory Visit: Payer: Self-pay

## 2019-06-22 ENCOUNTER — Other Ambulatory Visit (INDEPENDENT_AMBULATORY_CARE_PROVIDER_SITE_OTHER): Payer: BC Managed Care – PPO

## 2019-06-22 ENCOUNTER — Encounter: Payer: Self-pay | Admitting: Obstetrics & Gynecology

## 2019-06-22 VITALS — BP 120/80 | Wt 149.0 lb

## 2019-06-22 DIAGNOSIS — O26841 Uterine size-date discrepancy, first trimester: Secondary | ICD-10-CM | POA: Diagnosis not present

## 2019-06-22 DIAGNOSIS — Z3A09 9 weeks gestation of pregnancy: Secondary | ICD-10-CM

## 2019-06-22 DIAGNOSIS — N926 Irregular menstruation, unspecified: Secondary | ICD-10-CM

## 2019-06-22 DIAGNOSIS — Z3490 Encounter for supervision of normal pregnancy, unspecified, unspecified trimester: Secondary | ICD-10-CM | POA: Insufficient documentation

## 2019-06-22 DIAGNOSIS — Z124 Encounter for screening for malignant neoplasm of cervix: Secondary | ICD-10-CM | POA: Diagnosis not present

## 2019-06-22 DIAGNOSIS — Z3402 Encounter for supervision of normal first pregnancy, second trimester: Secondary | ICD-10-CM

## 2019-06-22 DIAGNOSIS — Z3491 Encounter for supervision of normal pregnancy, unspecified, first trimester: Secondary | ICD-10-CM

## 2019-06-22 DIAGNOSIS — Z3689 Encounter for other specified antenatal screening: Secondary | ICD-10-CM

## 2019-06-22 DIAGNOSIS — Z3A15 15 weeks gestation of pregnancy: Secondary | ICD-10-CM

## 2019-06-22 NOTE — Patient Instructions (Addendum)
Due Date 01/23/2020  First Trimester of Pregnancy The first trimester of pregnancy is from week 1 until the end of week 13 (months 1 through 3). A week after a sperm fertilizes an egg, the egg will implant on the wall of the uterus. This embryo will begin to develop into a baby. Genes from you and your partner will form the baby. The female genes will determine whether the baby will be a boy or a girl. At 6-8 weeks, the eyes and face will be formed, and the heartbeat can be seen on ultrasound. At the end of 12 weeks, all the baby's organs will be formed. Now that you are pregnant, you will want to do everything you can to have a healthy baby. Two of the most important things are to get good prenatal care and to follow your health care provider's instructions. Prenatal care is all the medical care you receive before the baby's birth. This care will help prevent, find, and treat any problems during the pregnancy and childbirth. Body changes during your first trimester Your body goes through many changes during pregnancy. The changes vary from woman to woman.  You may gain or lose a couple of pounds at first.  You may feel sick to your stomach (nauseous) and you may throw up (vomit). If the vomiting is uncontrollable, call your health care provider.  You may tire easily.  You may develop headaches that can be relieved by medicines. All medicines should be approved by your health care provider.  You may urinate more often. Painful urination may mean you have a bladder infection.  You may develop heartburn as a result of your pregnancy.  You may develop constipation because certain hormones are causing the muscles that push stool through your intestines to slow down.  You may develop hemorrhoids or swollen veins (varicose veins).  Your breasts may begin to grow larger and become tender. Your nipples may stick out more, and the tissue that surrounds them (areola) may become darker.  Your gums may  bleed and may be sensitive to brushing and flossing.  Dark spots or blotches (chloasma, mask of pregnancy) may develop on your face. This will likely fade after the baby is born.  Your menstrual periods will stop.  You may have a loss of appetite.  You may develop cravings for certain kinds of food.  You may have changes in your emotions from day to day, such as being excited to be pregnant or being concerned that something may go wrong with the pregnancy and baby.  You may have more vivid and strange dreams.  You may have changes in your hair. These can include thickening of your hair, rapid growth, and changes in texture. Some women also have hair loss during or after pregnancy, or hair that feels dry or thin. Your hair will most likely return to normal after your baby is born. What to expect at prenatal visits During a routine prenatal visit:  You will be weighed to make sure you and the baby are growing normally.  Your blood pressure will be taken.  Your abdomen will be measured to track your baby's growth.  The fetal heartbeat will be listened to between weeks 10 and 14 of your pregnancy.  Test results from any previous visits will be discussed. Your health care provider may ask you:  How you are feeling.  If you are feeling the baby move.  If you have had any abnormal symptoms, such as leaking fluid, bleeding,  severe headaches, or abdominal cramping.  If you are using any tobacco products, including cigarettes, chewing tobacco, and electronic cigarettes.  If you have any questions. Other tests that may be performed during your first trimester include:  Blood tests to find your blood type and to check for the presence of any previous infections. The tests will also be used to check for low iron levels (anemia) and protein on red blood cells (Rh antibodies). Depending on your risk factors, or if you previously had diabetes during pregnancy, you may have tests to check for  high blood sugar that affects pregnant women (gestational diabetes).  Urine tests to check for infections, diabetes, or protein in the urine.  An ultrasound to confirm the proper growth and development of the baby.  Fetal screens for spinal cord problems (spina bifida) and Down syndrome.  HIV (human immunodeficiency virus) testing. Routine prenatal testing includes screening for HIV, unless you choose not to have this test.  You may need other tests to make sure you and the baby are doing well. Follow these instructions at home: Medicines  Follow your health care provider's instructions regarding medicine use. Specific medicines may be either safe or unsafe to take during pregnancy.  Take a prenatal vitamin that contains at least 600 micrograms (mcg) of folic acid.  If you develop constipation, try taking a stool softener if your health care provider approves. Eating and drinking   Eat a balanced diet that includes fresh fruits and vegetables, whole grains, good sources of protein such as meat, eggs, or tofu, and low-fat dairy. Your health care provider will help you determine the amount of weight gain that is right for you.  Avoid raw meat and uncooked cheese. These carry germs that can cause birth defects in the baby.  Eating four or five small meals rather than three large meals a day may help relieve nausea and vomiting. If you start to feel nauseous, eating a few soda crackers can be helpful. Drinking liquids between meals, instead of during meals, also seems to help ease nausea and vomiting.  Limit foods that are high in fat and processed sugars, such as fried and sweet foods.  To prevent constipation: ? Eat foods that are high in fiber, such as fresh fruits and vegetables, whole grains, and beans. ? Drink enough fluid to keep your urine clear or pale yellow. Activity  Exercise only as directed by your health care provider. Most women can continue their usual exercise routine  during pregnancy. Try to exercise for 30 minutes at least 5 days a week. Exercising will help you: ? Control your weight. ? Stay in shape. ? Be prepared for labor and delivery.  Experiencing pain or cramping in the lower abdomen or lower back is a good sign that you should stop exercising. Check with your health care provider before continuing with normal exercises.  Try to avoid standing for long periods of time. Move your legs often if you must stand in one place for a long time.  Avoid heavy lifting.  Wear low-heeled shoes and practice good posture.  You may continue to have sex unless your health care provider tells you not to. Relieving pain and discomfort  Wear a good support bra to relieve breast tenderness.  Take warm sitz baths to soothe any pain or discomfort caused by hemorrhoids. Use hemorrhoid cream if your health care provider approves.  Rest with your legs elevated if you have leg cramps or low back pain.  If you  develop varicose veins in your legs, wear support hose. Elevate your feet for 15 minutes, 3-4 times a day. Limit salt in your diet. Prenatal care  Schedule your prenatal visits by the twelfth week of pregnancy. They are usually scheduled monthly at first, then more often in the last 2 months before delivery.  Write down your questions. Take them to your prenatal visits.  Keep all your prenatal visits as told by your health care provider. This is important. Safety  Wear your seat belt at all times when driving.  Make a list of emergency phone numbers, including numbers for family, friends, the hospital, and police and fire departments. General instructions  Ask your health care provider for a referral to a local prenatal education class. Begin classes no later than the beginning of month 6 of your pregnancy.  Ask for help if you have counseling or nutritional needs during pregnancy. Your health care provider can offer advice or refer you to specialists  for help with various needs.  Do not use hot tubs, steam rooms, or saunas.  Do not douche or use tampons or scented sanitary pads.  Do not cross your legs for long periods of time.  Avoid cat litter boxes and soil used by cats. These carry germs that can cause birth defects in the baby and possibly loss of the fetus by miscarriage or stillbirth.  Avoid all smoking, herbs, alcohol, and medicines not prescribed by your health care provider. Chemicals in these products affect the formation and growth of the baby.  Do not use any products that contain nicotine or tobacco, such as cigarettes and e-cigarettes. If you need help quitting, ask your health care provider. You may receive counseling support and other resources to help you quit.  Schedule a dentist appointment. At home, brush your teeth with a soft toothbrush and be gentle when you floss. Contact a health care provider if:  You have dizziness.  You have mild pelvic cramps, pelvic pressure, or nagging pain in the abdominal area.  You have persistent nausea, vomiting, or diarrhea.  You have a bad smelling vaginal discharge.  You have pain when you urinate.  You notice increased swelling in your face, hands, legs, or ankles.  You are exposed to fifth disease or chickenpox.  You are exposed to [place] measles (rubella) and have never had it. Get help right away if:  You have a fever.  You are leaking fluid from your vagina.  You have spotting or bleeding from your vagina.  You have severe abdominal cramping or pain.  You have rapid weight gain or loss.  You vomit blood or material that looks like coffee grounds.  You develop a severe headache.  You have shortness of breath.  You have any kind of trauma, such as from a fall or a car accident. Summary  The first trimester of pregnancy is from week 1 until the end of week 13 (months 1 through 3).  Your body goes through many changes during pregnancy. The changes  vary from woman to woman.  You will have routine prenatal visits. During those visits, your health care provider will examine you, discuss any test results you may have, and talk with you about how you are feeling. This information is not intended to replace advice given to you by your health care provider. Make sure you discuss any questions you have with your health care provider. Document Revised: 04/08/2017 Document Reviewed: 04/07/2016 Elsevier Patient Education  2020 [employer].

## 2019-06-22 NOTE — Progress Notes (Signed)
06/22/2019   Chief Complaint: Missed period  History of Present Illness: Carmen Keller is a 29 y.o. G1P0 [redacted]w[redacted]d based on Patient's last menstrual period was 03/06/2019. with an Estimated Date of Delivery: 12/11/19, with the above CC. This was the first bleeding she had after her IUD was removed in October.  SHe had breast T in Dec (neg preg test) and nausea in early Jan (with pos uCG then x2).  She has Positive signs or symptoms of nausea/vomiting of pregnancy. She has Negative signs or symptoms of miscarriage or preterm labor She identifies Negative Zika risk factors for her and her partner On any different medications around the time she conceived/early pregnancy: No  History of varicella: Yes   ROS: A 12-point review of systems was performed and negative, except as stated in the above HPI.  OBGYN History: As per HPI. OB History  Gravida Para Term Preterm AB Living  1            SAB TAB Ectopic Multiple Live Births               # Outcome Date GA Lbr Len/2nd Weight Sex Delivery Anes PTL Lv  1 Current             Any issues with any prior pregnancies: no Any prior children are healthy, doing well, without any problems or issues: no History of pap smears: Yes. Last pap smear 2019. Abnormal: no  History of STIs: No   Past Medical History: Past Medical History:  Diagnosis Date  . Allergy   . Anxiety   . Depression     Past Surgical History: Past Surgical History:  Procedure Laterality Date  . KNEE SURGERY      Family History:  Family History  Problem Relation Age of Onset  . Diabetes Father   . Cancer Maternal Grandfather   . Diabetes Paternal Grandfather    She denies any female cancers, bleeding or blood clotting disorders.  She denies any history of mental retardation, birth defects or genetic disorders in her or the FOB's history  Social History:  Social History   Socioeconomic History  . Marital status: Single    Spouse name: Not on file  . Number of  children: Not on file  . Years of education: Not on file  . Highest education level: Not on file  Occupational History  . Not on file  Tobacco Use  . Smoking status: Never Smoker  Substance and Sexual Activity  . Alcohol use: No  . Drug use: No  . Sexual activity: Yes    Birth control/protection: None  Other Topics Concern  . Not on file  Social History Narrative  . Not on file   Social Determinants of Health   Financial Resource Strain:   . Difficulty of Paying Living Expenses: Not on file  Food Insecurity:   . Worried About Charity fundraiser in the Last Year: Not on file  . Ran Out of Food in the Last Year: Not on file  Transportation Needs:   . Lack of Transportation (Medical): Not on file  . Lack of Transportation (Non-Medical): Not on file  Physical Activity:   . Days of Exercise per Week: Not on file  . Minutes of Exercise per Session: Not on file  Stress:   . Feeling of Stress : Not on file  Social Connections:   . Frequency of Communication with Friends and Family: Not on file  . Frequency of Social Gatherings with  Friends and Family: Not on file  . Attends Religious Services: Not on file  . Active Member of Clubs or Organizations: Not on file  . Attends Banker Meetings: Not on file  . Marital Status: Not on file  Intimate Partner Violence:   . Fear of Current or Ex-Partner: Not on file  . Emotionally Abused: Not on file  . Physically Abused: Not on file  . Sexually Abused: Not on file   Any pets in the household: yes   Dogs x2  Allergy: No Known Allergies  Current Outpatient Medications:  Current Outpatient Medications:  .  doxylamine, Sleep, (UNISOM) 25 MG tablet, Take 25 mg by mouth at bedtime as needed., Disp: , Rfl:  .  phenazopyridine (PYRIDIUM) 100 MG tablet, Take 1 tablet (100 mg total) by mouth 3 (three) times daily as needed for pain., Disp: 10 tablet, Rfl: 0   Physical Exam:   BP 120/80   Wt 149 lb (67.6 kg)   LMP  03/06/2019   BMI 27.25 kg/m  Body mass index is 27.25 kg/m. Constitutional: Well nourished, well developed female in no acute distress.  Neck:  Supple, normal appearance, and no thyromegaly  Cardiovascular: S1, S2 normal, no murmur, rub or gallop, regular rate and rhythm Respiratory:  Clear to auscultation bilateral. Normal respiratory effort Abdomen: positive bowel sounds and no masses, hernias; diffusely non tender to palpation, non distended Breasts: breasts appear normal, no suspicious masses, no skin or nipple changes or axillary nodes. Neuro/Psych:  Normal mood and affect.  Skin:  Warm and dry.  Lymphatic:  No inguinal lymphadenopathy.   Pelvic exam: is not limited by body habitus EGBUS: within normal limits, Vagina: within normal limits and with no blood in the vault, Cervix: normal appearing cervix without discharge or lesions, closed/long/high, Uterus:  enlarged: 2 mos, and Adnexa:  normal adnexa  Assessment: [name] is a 29 y.o. G1P0 [redacted]w[redacted]d based on Patient's last menstrual period was 03/06/2019. with an Estimated Date of Delivery: 12/11/19,  for prenatal care.  Plan:  1) Avoid alcoholic beverages. 2) Patient encouraged not to smoke.  3) Discontinue the use of all non-medicinal drugs and chemicals.  4) Take prenatal vitamins daily.  5) Seatbelt use advised 6) Nutrition, food safety (fish, cheese advisories, and high nitrite foods) and exercise discussed. 7) Hospital and practice style delivering at [facility] discussed  8) Patient is asked about travel to areas at risk for the Zika virus, and counseled to avoid travel and exposure to mosquitoes or sexual partners who may have themselves been exposed to the virus. Testing is discussed, and will be ordered as appropriate.  9) Childbirth classes at [facility] advised 10) Genetic Screening, such as with 1st Trimester Screening, cell free fetal DNA, AFP testing, and Ultrasound, as well as with amniocentesis and CVS as appropriate, is  discussed with patient. She plans to have genetic testing this pregnancy. 11) Flu shot UTD 12) [place] today  Problem list reviewed and updated.  Annamarie Major, MD, FACOG [facility], [facility] 06/22/2019  3:29 PM   Addendum  US shows singleton IUP at 9 2/7 weeks w [facility] 01/23/2020 FHT 170 Will adjust EDC accordingly Plan visit 2 weeks, can have NIPT done if desires then  Annamarie Major, MD, [facility], [facility] 06/22/2019  3:30 PM

## 2019-06-23 LAB — RPR+RH+ABO+RUB AB+AB SCR+CB...
Antibody Screen: NEGATIVE
HIV Screen 4th Generation wRfx: NONREACTIVE
Hematocrit: 39.3 % (ref 34.0–46.6)
Hemoglobin: 12.9 g/dL (ref 11.1–15.9)
Hepatitis B Surface Ag: NEGATIVE
MCH: 30.3 pg (ref 26.6–33.0)
MCHC: 32.8 g/dL (ref 31.5–35.7)
MCV: 92 fL (ref 79–97)
Platelets: 227 10*3/uL (ref 150–450)
RBC: 4.26 x10E6/uL (ref 3.77–5.28)
RDW: 12.6 % (ref 11.7–15.4)
RPR Ser Ql: NONREACTIVE
Rh Factor: POSITIVE
Rubella Antibodies, IGG: <0.9 index — ABNORMAL LOW (ref >0.99)
Varicella zoster IgG: 2692 index (ref >165)
WBC: 6 10*3/uL (ref 3.4–10.8)

## 2019-06-24 LAB — URINE CULTURE

## 2019-06-27 LAB — CYTOLOGY - PAP
Chlamydia: NEGATIVE
Comment: NEGATIVE
Comment: NEGATIVE
Comment: NORMAL
Diagnosis: NEGATIVE
Neisseria Gonorrhea: NEGATIVE
Trichomonas: NEGATIVE

## 2019-07-06 ENCOUNTER — Other Ambulatory Visit: Payer: Self-pay

## 2019-07-06 ENCOUNTER — Encounter: Payer: Self-pay | Admitting: Advanced Practice Midwife

## 2019-07-06 ENCOUNTER — Ambulatory Visit (INDEPENDENT_AMBULATORY_CARE_PROVIDER_SITE_OTHER): Payer: BC Managed Care – PPO | Admitting: Advanced Practice Midwife

## 2019-07-06 VITALS — BP 122/74 | Wt 151.0 lb

## 2019-07-06 DIAGNOSIS — Z3401 Encounter for supervision of normal first pregnancy, first trimester: Secondary | ICD-10-CM

## 2019-07-06 DIAGNOSIS — Z3490 Encounter for supervision of normal pregnancy, unspecified, unspecified trimester: Secondary | ICD-10-CM

## 2019-07-06 DIAGNOSIS — Z3A11 11 weeks gestation of pregnancy: Secondary | ICD-10-CM

## 2019-07-06 NOTE — Progress Notes (Signed)
  Routine Prenatal Care Visit  Subjective  Carmen Keller is a 29 y.o. G1P0 at [redacted]w[redacted]d being seen today for ongoing prenatal care.  She is currently monitored for the following issues for this low-risk pregnancy and has Encounter for supervision of low-risk pregnancy, antepartum and Herpes simplex on their problem list.  ----------------------------------------------------------------------------------- Patient reports constipation. We reviewed safe meds and other comfort measures. She has had some brief sharp lower right side pains and occasional cramps. Discussed round ligament and normal pregnancy symptoms.    . Vag. Bleeding: None.   . Leaking Fluid denies.  ----------------------------------------------------------------------------------- The following portions of the patient's history were reviewed and updated as appropriate: allergies, current medications, past family history, past medical history, past social history, past surgical history and problem list. Problem list updated.  Objective  Blood pressure 122/74, weight 151 lb (68.5 kg), last menstrual period 03/06/2019. Pregravid weight 137 lb (62.1 kg) Total Weight Gain 14 lb (6.35 kg) Urinalysis: Urine Protein    Urine Glucose    Fetal Status: Fetal Heart Rate (bpm): 152         General:  Alert, oriented and cooperative. Patient is in no acute distress.  Skin: Skin is warm and dry. No rash noted.   Cardiovascular: Normal heart rate noted  Respiratory: Normal respiratory effort, no problems with respiration noted  Abdomen: Soft, gravid, appropriate for gestational age. Pain/Pressure: Present     Pelvic:  Cervical exam deferred        Extremities: Normal range of motion.     Mental Status: Normal mood and affect. Normal behavior. Normal judgment and thought content.   Assessment   29 y.o. G1P0 at [redacted]w[redacted]d by  01/23/2020, by Ultrasound presenting for routine prenatal visit  Plan   [email or web address] Problems (from 03/06/19 to  present)    No problems associated with this episode.     MaterniT 21 today   Preterm labor symptoms and general obstetric precautions including but not limited to vaginal bleeding, contractions, leaking of fluid and fetal movement were reviewed in detail with the patient. Please refer to After Visit Summary for other counseling recommendations.   Return in about 4 weeks (around 08/03/2019) for rob.  Tresea Mall, CNM 07/06/2019 3:09 PM

## 2019-07-06 NOTE — Patient Instructions (Signed)

## 2019-07-06 NOTE — Progress Notes (Signed)
No vb. No lof. Some sharp pains.

## 2019-07-12 LAB — MATERNIT 21 PLUS CORE, BLOOD
Fetal Fraction: 10
Result (T21): NEGATIVE
Trisomy 13 (Patau syndrome): NEGATIVE
Trisomy 18 (Edwards syndrome): NEGATIVE
Trisomy 21 (Down syndrome): NEGATIVE

## 2019-08-06 ENCOUNTER — Other Ambulatory Visit: Payer: Self-pay

## 2019-08-06 ENCOUNTER — Ambulatory Visit (INDEPENDENT_AMBULATORY_CARE_PROVIDER_SITE_OTHER): Payer: BC Managed Care – PPO | Admitting: Obstetrics and Gynecology

## 2019-08-06 ENCOUNTER — Encounter: Payer: BC Managed Care – PPO | Admitting: Advanced Practice Midwife

## 2019-08-06 VITALS — BP 112/74 | Wt 151.0 lb

## 2019-08-06 DIAGNOSIS — Z363 Encounter for antenatal screening for malformations: Secondary | ICD-10-CM

## 2019-08-06 DIAGNOSIS — Z3A15 15 weeks gestation of pregnancy: Secondary | ICD-10-CM

## 2019-08-06 DIAGNOSIS — Z31438 Encounter for other genetic testing of female for procreative management: Secondary | ICD-10-CM

## 2019-08-06 DIAGNOSIS — Z3492 Encounter for supervision of normal pregnancy, unspecified, second trimester: Secondary | ICD-10-CM

## 2019-08-06 DIAGNOSIS — Z3490 Encounter for supervision of normal pregnancy, unspecified, unspecified trimester: Secondary | ICD-10-CM

## 2019-08-06 LAB — POCT URINALYSIS DIPSTICK OB
Glucose, UA: NEGATIVE
POC,PROTEIN,UA: NEGATIVE

## 2019-08-06 NOTE — Progress Notes (Signed)
    Routine Prenatal Care Visit  Subjective  Carmen Keller is a 29 y.o. G1P0 at [redacted]w[redacted]d being seen today for ongoing prenatal care.  She is currently monitored for the following issues for this low-risk pregnancy and has Encounter for supervision of low-risk pregnancy, antepartum and Herpes simplex on their problem list.  ----------------------------------------------------------------------------------- Patient reports no complaints.   Contractions: Not present. Vag. Bleeding: None.  Movement: Absent. Denies leaking of fluid.  ----------------------------------------------------------------------------------- The following portions of the patient's history were reviewed and updated as appropriate: allergies, current medications, past family history, past medical history, past social history, past surgical history and problem list. Problem list updated.   Objective  Blood pressure 112/74, weight 151 lb (68.5 kg), last menstrual period 03/06/2019. Pregravid weight 137 lb (62.1 kg) Total Weight Gain 14 lb (6.35 kg) Urinalysis:      Fetal Status: Fetal Heart Rate (bpm): 145   Movement: Absent     General:  Alert, oriented and cooperative. Patient is in no acute distress.  Skin: Skin is warm and dry. No rash noted.   Cardiovascular: Normal heart rate noted  Respiratory: Normal respiratory effort, no problems with respiration noted  Abdomen: Soft, gravid, appropriate for gestational age. Pain/Pressure: Absent     Pelvic:  Cervical exam deferred        Extremities: Normal range of motion.     ental Status: Normal mood and affect. Normal behavior. Normal judgment and thought content.     Assessment   29 y.o. G1P0 at [redacted]w[redacted]d by  01/23/2020, by Ultrasound presenting for routine prenatal visit  Plan   pregnancy1 Problems (from 03/06/19 to present)    Problem Noted Resolved   Encounter for supervision of low-risk pregnancy, antepartum 06/22/2019 by Nadara Mustard, MD No   Overview  Addendum 08/06/2019 11:19 AM by Vena Austria, MD    Clinic [facility] Prenatal Labs  Dating 9 week [place] Blood type: B/Positive/-- (02/12 1537)   Genetic Screen NIPS: Normal XX Antibody:Negative (02/12 1537)  Anatomic [place]  Rubella: <0.90 (02/12 1537) Varicella: Immune  GTT Third trimester:  RPR: Non Reactive (02/12 1537)   Rhogam N/A HBsAg: Negative (02/12 1537)   TDaP vaccine      Flu Shot: 01/2019 HIV: Non Reactive (02/12 1537)   Baby Food                                GBS:   Contraception  Pap:06/22/19 NIL  CBB  No   CS/VBAC n/a   Support Person BF [name]               Gestational age appropriate obstetric precautions including but not limited to vaginal bleeding, contractions, leaking of fluid and fetal movement were reviewed in detail with the patient.    - Discussed carrier testing for CF, Fragile-X, and SMA.  Patient agreeable with proceeding with screening today  Return in about 4 weeks (around 09/03/2019) for ROB and anatomy scan.  Vena Austria, MD, [facility], [facility] 08/06/2019, 11:37 AM

## 2019-08-06 NOTE — Addendum Note (Signed)
Addended by: [place], Ralph Benavidez B on: 08/06/2019 11:40 AM   Modules accepted: Orders

## 2019-08-06 NOTE — Progress Notes (Signed)
ROB

## 2019-08-14 LAB — INHERITEST CORE(CF97,SMA,FRAX)

## 2019-08-23 ENCOUNTER — Encounter: Payer: BC Managed Care – PPO | Admitting: Obstetrics and Gynecology

## 2019-08-27 ENCOUNTER — Encounter: Payer: BC Managed Care – PPO | Admitting: Obstetrics and Gynecology

## 2019-09-03 ENCOUNTER — Ambulatory Visit (INDEPENDENT_AMBULATORY_CARE_PROVIDER_SITE_OTHER): Payer: BC Managed Care – PPO

## 2019-09-03 ENCOUNTER — Other Ambulatory Visit: Payer: Self-pay

## 2019-09-03 ENCOUNTER — Ambulatory Visit (INDEPENDENT_AMBULATORY_CARE_PROVIDER_SITE_OTHER): Payer: BC Managed Care – PPO | Admitting: Obstetrics and Gynecology

## 2019-09-03 VITALS — BP 110/66 | Wt 161.0 lb

## 2019-09-03 DIAGNOSIS — Z3490 Encounter for supervision of normal pregnancy, unspecified, unspecified trimester: Secondary | ICD-10-CM

## 2019-09-03 DIAGNOSIS — Z3492 Encounter for supervision of normal pregnancy, unspecified, second trimester: Secondary | ICD-10-CM

## 2019-09-03 DIAGNOSIS — Z3A19 19 weeks gestation of pregnancy: Secondary | ICD-10-CM

## 2019-09-03 DIAGNOSIS — Z3A20 20 weeks gestation of pregnancy: Secondary | ICD-10-CM | POA: Diagnosis not present

## 2019-09-03 DIAGNOSIS — Z363 Encounter for antenatal screening for malformations: Secondary | ICD-10-CM | POA: Diagnosis not present

## 2019-09-03 LAB — POCT URINALYSIS DIPSTICK OB
Glucose, UA: NEGATIVE
POC,PROTEIN,UA: NEGATIVE

## 2019-09-03 NOTE — Progress Notes (Signed)
ROB Anatomy scan/ It's a GIRL

## 2019-09-03 NOTE — Progress Notes (Signed)
Routine Prenatal Care Visit  Subjective  Adena Sima is a 29 y.o. G1P0 at [redacted]w[redacted]d being seen today for ongoing prenatal care.  She is currently monitored for the following issues for this low-risk pregnancy and has Encounter for supervision of low-risk pregnancy, antepartum and Herpes simplex on their problem list.  ----------------------------------------------------------------------------------- Patient reports no complaints.   Contractions: Not present. Vag. Bleeding: None.  Movement: Present. Denies leaking of fluid.  ----------------------------------------------------------------------------------- The following portions of the patient's history were reviewed and updated as appropriate: allergies, current medications, past family history, past medical history, past social history, past surgical history and problem list. Problem list updated.   Objective  Blood pressure 110/66, weight 161 lb (73 kg), last menstrual period 03/06/2019. Pregravid weight 137 lb (62.1 kg) Total Weight Gain 24 lb (10.9 kg) Urinalysis:      Fetal Status: Fetal Heart Rate (bpm): 145   Movement: Present     General:  Alert, oriented and cooperative. Patient is in no acute distress.  Skin: Skin is warm and dry. No rash noted.   Cardiovascular: Normal heart rate noted  Respiratory: Normal respiratory effort, no problems with respiration noted  Abdomen: Soft, gravid, appropriate for gestational age. Pain/Pressure: Absent     Pelvic:  Cervical exam deferred        Extremities: Normal range of motion.     ental Status: Normal mood and affect. Normal behavior. Normal judgment and thought content.   US OB Comp + 14 Wk  Result Date: 09/03/2019 Patient Name: Lossie Kalp DOB: 1991-01-16 MRN: [id number] ULTRASOUND REPORT [facility] Date of Service: 09/03/2019 Indications:Anatomy Ultrasound Findings: Nelda Marseille intrauterine pregnancy is visualized with FHR at 145 BPM. Biometrics  give an (U/S) Gestational age of [redacted]w[redacted]d and an (U/S) EDD of 01/21/2020; this correlates with the clinically established Estimated Date of Delivery: 01/23/20 Fetal presentation is Cephalic. EFW: 317 g ( 11 oz ). Placenta: posterior. Grade: 0 AFI: subjectively normal. Anatomic survey is complete and normal; Gender - female.  Impression: 1. [redacted]w[redacted]d Viable Singleton Intrauterine pregnancy by U/S. 2. (U/S) EDD is consistent with Clinically established Estimated Date of Delivery: 01/23/20 . 3. Normal Anatomy Scan Recommendations: 1.Clinical correlation with the patient's History and Physical Exam. Gweneth Dimitri, RT  There is a singleton gestation with subjectively normal amniotic fluid volume. The fetal biometry correlates with established dating. Detailed evaluation of the fetal anatomy was performed.The fetal anatomical survey appears within normal limits within the resolution of ultrasound as described above.  It must be noted that a normal ultrasound is unable to rule out fetal aneuploidy, subtle defects such as small ASD or VDS may also not be visible on imaging.  Malachy Mood, MD, [facility], [facility] 09/03/2019, 3:52 PM     Assessment   29 y.o. G1P0 at [redacted]w[redacted]d by  01/23/2020, by Ultrasound presenting for routine prenatal visit  Plan   pregnancy1 Problems (from 03/06/19 to present)    Problem Noted Resolved   Encounter for supervision of low-risk pregnancy, antepartum 06/22/2019 by Gae Dry, MD No   Overview Addendum 08/20/2019 12:40 PM by Malachy Mood, MD    [facility]inic [facility] Prenatal Labs  Dating 9 week [place] Blood type: B/Positive/-- (02/12 1537)   Genetic Screen NIPS: Normal XX, Inheritest: CF neg, Fragile-X neg, SMA neg Antibody:Negative (02/12 1537)  Anatomic [place] Complete Rubella: <0.90 (02/12 1537) Varicella: Immune  GTT Third trimester:  RPR: Non Reactive (02/12 1537)   Rhogam N/A HBsAg: Negative (02/12  1537)   TDaP vaccine      Flu Shot: 01/2019 HIV: Non  Reactive (02/12 1537)   Baby Food                                GBS:   Contraception  Pap:06/22/19 NIL  CBB  No   CS/VBAC n/a   Support Person BF Micah Noel               Gestational age appropriate obstetric precautions including but not limited to vaginal bleeding, contractions, leaking of fluid and fetal movement were reviewed in detail with the patient.    - Normal anatomy scan today  Return in about 4 weeks (around 10/01/2019) for ROB.  Vena Austria, MD, [facility], [facility] 09/03/2019, 3:52 PM

## 2019-09-20 ENCOUNTER — Telehealth: Payer: Self-pay

## 2019-09-20 NOTE — Telephone Encounter (Signed)
Patient is going fishing on a boat in the [place] this weekend. Inquiring if it is safe to take Non-drowsy Dramamine for nausea. Cb#[phone]

## 2019-09-20 NOTE — Telephone Encounter (Signed)
Per [facility] non-drowsy dramamine ok and be careful on the boat (avoid falling if seas are rough, etc). Patient aware.

## 2019-09-25 ENCOUNTER — Other Ambulatory Visit: Payer: Self-pay | Admitting: Obstetrics and Gynecology

## 2019-09-25 DIAGNOSIS — M25559 Pain in unspecified hip: Secondary | ICD-10-CM

## 2019-09-25 MED ORDER — CYCLOBENZAPRINE HCL 10 MG PO TABS: 10.0000 mg | ORAL_TABLET | Freq: Three times a day (TID) | ORAL | 0 refills | Status: DC | PRN

## 2019-10-03 ENCOUNTER — Other Ambulatory Visit: Payer: Self-pay

## 2019-10-03 ENCOUNTER — Ambulatory Visit (INDEPENDENT_AMBULATORY_CARE_PROVIDER_SITE_OTHER): Payer: BC Managed Care – PPO | Admitting: Obstetrics

## 2019-10-03 VITALS — BP 100/60 | Wt 166.0 lb

## 2019-10-03 DIAGNOSIS — Z3490 Encounter for supervision of normal pregnancy, unspecified, unspecified trimester: Secondary | ICD-10-CM

## 2019-10-03 DIAGNOSIS — Z3A24 24 weeks gestation of pregnancy: Secondary | ICD-10-CM

## 2019-10-03 LAB — POCT URINALYSIS DIPSTICK OB
Glucose, UA: NEGATIVE
POC,PROTEIN,UA: NEGATIVE

## 2019-10-03 NOTE — Patient Instructions (Signed)

## 2019-10-03 NOTE — Addendum Note (Signed)
Addended by: Cornelius Moras D on: 10/03/2019 04:34 PM   Modules accepted: Orders

## 2019-10-03 NOTE — Progress Notes (Signed)
  Routine Prenatal Care Visit  Subjective  Carmen Keller is a 29 y.o. G1P0 at [redacted]w[redacted]d being seen today for ongoing prenatal care.  She is currently monitored for the following issues for this low-risk pregnancy and has Encounter for supervision of low-risk pregnancy, antepartum and Herpes simplex on their problem list.  ----------------------------------------------------------------------------------- Patient reports feeling like her baby has not moved as much over the last few days..   Contractions: Not present. Vag. Bleeding: None.  Movement: Present. Leaking Fluid denies.  ----------------------------------------------------------------------------------- The following portions of the patient's history were reviewed and updated as appropriate: allergies, current medications, past family history, past medical history, past social history, past surgical history and problem list. Problem list updated.  Objective  Blood pressure 100/60, weight 166 lb (75.3 kg), last menstrual period 03/06/2019. Pregravid weight 137 lb (62.1 kg) Total Weight Gain 29 lb (13.2 kg) Urinalysis: Urine Protein    Urine Glucose    Fetal Status:     Movement: Present     General:  Alert, oriented and cooperative. Patient is in no acute distress.  Skin: Skin is warm and dry. No rash noted.   Cardiovascular: Normal heart rate noted  Respiratory: Normal respiratory effort, no problems with respiration noted  Abdomen: Soft, gravid, appropriate for gestational age. Pain/Pressure: Present     Pelvic:  Cervical exam deferred        Extremities: Normal range of motion.     Mental Status: Normal mood and affect. Normal behavior. Normal judgment and thought content.   Assessment   29 y.o. G1P0 at [redacted]w[redacted]d by  01/23/2020, by Ultrasound presenting for routine prenatal visit Reassuring FHTs.  Plan   pregnancy1 Problems (from 03/06/19 to present)    Problem Noted Resolved   Encounter for supervision of low-risk  pregnancy, antepartum 06/22/2019 by Nadara Mustard, MD No   Overview Addendum 09/03/2019  3:53 PM by Vena Austria, MD    [facility] Prenatal Labs  Dating 9 week [place] Blood type: B/Positive/-- (02/12 1537)   Genetic Screen NIPS: Normal XX, Inheritest: CF neg, Fragile-X neg, SMA neg Antibody:Negative (02/12 1537)  Anatomic [place] Complete Rubella: <0.90 (02/12 1537) Varicella: Immune  GTT Third trimester:  RPR: Non Reactive (02/12 1537)   Rhogam N/A HBsAg: Negative (02/12 1537)   TDaP vaccine      Flu Shot: 01/2019 HIV: Non Reactive (02/12 1537)   Baby Food                                GBS:   Contraception  Pap:06/22/19 NIL  CBB  No   CS/VBAC n/a   Support Person BF [name]               Preterm labor symptoms and general obstetric precautions including but not limited to vaginal bleeding, contractions, leaking of fluid and fetal movement were reviewed in detail with the patient. Please refer to After Visit Summary for other counseling recommendations.  Discussed her next appt when she will have a 1hr GTT. She plans on getting the tdap.  Strong encouragement to take a CBE class and the breastfeeding class. RTC 3 weeks for ROB and 1hr GTT.  No follow-ups on file.  Mirna Mires, CNM  10/03/2019 4:25 PM

## 2019-10-10 ENCOUNTER — Other Ambulatory Visit: Payer: Self-pay

## 2019-10-10 ENCOUNTER — Ambulatory Visit: Payer: BC Managed Care – PPO | Attending: Obstetrics and Gynecology | Admitting: Physical Therapy

## 2019-10-10 ENCOUNTER — Encounter: Payer: Self-pay | Admitting: Physical Therapy

## 2019-10-10 DIAGNOSIS — R293 Abnormal posture: Secondary | ICD-10-CM | POA: Diagnosis present

## 2019-10-10 DIAGNOSIS — M25551 Pain in right hip: Secondary | ICD-10-CM | POA: Diagnosis not present

## 2019-10-10 DIAGNOSIS — M6281 Muscle weakness (generalized): Secondary | ICD-10-CM | POA: Diagnosis present

## 2019-10-10 NOTE — Therapy (Signed)
[facility] [address] [place], [place], [address] Phone: [phone]   Fax:  [phone]  Physical Therapy Evaluation  Patient Details  Name: Carmen Keller MRN: [id number] Date of Birth: Sep 22, 1990 Referring Provider (PT): Vena Austria   Encounter Date: 10/10/2019  PT End of Session - 10/10/19 1605    Visit Number  1    Number of Visits  12    Date for PT Re-Evaluation  01/02/20    PT Start Time  1605    PT Stop Time  1655    PT Time Calculation (min)  50 min    Activity Tolerance  Patient tolerated treatment well    Behavior During Therapy  [facility] for tasks assessed/performed       Past Medical History:  Diagnosis Date  . Allergy   . Anxiety   . Depression     Past Surgical History:  Procedure Laterality Date  . KNEE SURGERY      There were no vitals filed for this visit.       [facility] PT Assessment - 10/10/19 0001      Assessment   Medical Diagnosis  Hip/back pain antepartum    Referring Provider (PT)  Vena Austria       PELVIC HEALTH PHYSICAL THERAPY EVALUATION  SCREENING Red Flags: None Have you had any night sweats? Unexplained weight loss? Saddle anesthesia? Unexplained changes in bowel or bladder habits?  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 Patient notes 2 years ago she fell off a trolley and landed directly on tailbone and was cleared by the ED. Patient notes she had tenderness/pain at the tailbone for about 6 months after. Patient notes her tailbone is becoming more irritated as a result of pregnancy and work demands. Patient has been seen by a chiropractor and had adjustments for lumbar region. Patient adds that the pain is now migrating from R to L likely as a result of overuse on L. Patient has found some minimal relief with ice/heat,  position changes, and Flexeril. Patient feels like it is more aggravated at night when patient is working. Patient gets a sharp shooting pain in the gluteals when standing quickly at work. Patient adds that after a 12 hour shift her R leg is completely numb from the thigh down with continued tightness and pain at the posterior hip.   Pertinent History:  Falls Negative.  Scoliosis Negative. Pulmonary disease/dysfunction Negative. Surgical history: Negative.    Obstetrical History: G1P0 Deliveries: n/a Tearing/Episiotomy: n/a Birthing position: n/a  Gynecological History: Hysterectomy: No  Endometriosis: Negative Pain with exam: No   Urinary History: Incontinence: Positive. Onset: 09/2019 Triggers: coughing/sneezing Amount: Min. Fluid Intake: adequate H20, <200 mg caffeinated Nocturia: 0-2x/night Frequency of urination: every 2-3 hours Pain with urination: Negative Difficulty initiating urination: Negative; some straining to urinate Frequent UTI: Negative.   Gastrointestinal History: [place] Stool Chart: Type 4 with 2 senekot Frequency of BMs: 3x/week; chronic constipation Pain with defecation: Negative Straining with defecation: Negative Incontinence: Negative.  Sexual activity/pain: Pain with intercourse: Positive for hip and back pain with positions supine, sidelying.  Initial penetration: No  Deep thrustingNo   Location of pain: R back/hip Current pain:  6/10  Max pain:  8-9/10 Least pain:  3-4/10 Pain quality: pain quality: sharp and shooting Radiating pain: Yes down the RLE (posterior)   Patient  assessment of present state: I think it's sciatica.   Current activities:  Walking trail with pet labs; boating; travel  Patient Goals:  Pain relief   OBJECTIVE  Mental Status Patient is oriented to person, place and time.  Recent memory is intact.  Remote memory is intact.  Attention span and concentration are intact.  Expressive speech is intact.   Patient's fund of knowledge is within normal limits for educational level.  POSTURE/OBSERVATIONS:  Lumbar lordosis: increased with anterior pelvic tilt Iliac crest height: equal bilaterally Lumbar lateral shift: negative Pelvic obliquity: negative Leg length discrepancy: negative  GAIT: Trendelenburg R: Positive L: Negative  RANGE OF MOTION:    LEFT RIGHT  Lumbar forward flexion (65):  40 degrees with hamstring compensations    Lumbar extension (30): WFL* discomfort    Lumbar lateral flexion (25):  [facility]  Thoracic and Lumbar rotation (30 degrees):    [facility] WFL  Hip Flexion (0-125):   WFL* on R WFL* on R  Hip IR (0-45):  [facility] WFL  Hip ER (0-45):  [facility] WFL  Hip Abduction (0-40):  [facility] WFL  Hip extension (0-15):  [facility] WFL    SENSATION: Grossly intact to light touch bilateral LEs as determined by testing dermatomes L2-S2 Proprioception and hot/cold testing deferred on this date  STRENGTH: MMT   RLE LLE  Hip Flexion 3+ 3+  Hip Extension 4 4  Hip Abduction  5 5  Hip Adduction  5 5  Hip ER  4 5  Hip IR  4 5  Knee Extension 5 5  Knee Flexion 5 5  Dorsiflexion  5 5  Plantarflexion (seated) 5 5    SPECIAL TESTS: Centralization and Peripheralization (SN 92, -LR 0.12): Negative Slump (SN 83, -LR 0.32): R: Positive L: Negative Stork/March (SP 93): R: Negative L: Negative  PHYSICAL PERFORMANCE MEASURES: STS: WNL   EXTERNAL PELVIC EXAM: deferred 2/2 to time constraints Palpation: Breath coordination: Cued Lengthen: Cued Contraction: Cough:   OUTCOME MEASURES: FOTO PFDI Pain 25   ASSESSMENT Patient is a 29 year old presenting to clinic with chief complaints of R hip and low back pain. Upon examination, patient demonstrates deficits in posture, pain, lumbar range of motion, RLE strength as evidenced by increased anterior tilt of pelvis in standing, lumbar flexion of 40 degrees, 8-9/10 worst pain, RLE gross strength 4/5, positive R slump test. Patient's responses on FOTO  outcome measures (25) indicate moderate to minima functional limitations/disability/distress. Patient's progress may be limited due to the progressive nature of pregnancy; however, patient's motivation and health literacy is advantageous. Patient was able to achieve basic understanding of postural adaptations that occur during pregnancy during today's evaluation and responded positively to educational interventions. Patient will benefit from continued skilled therapeutic intervention to address deficits in posture, pain, lumbar range of motion, RLE strength in order to increase function, and improve overall QOL.  EDUCATION Patient educated on prognosis, POC, and provided with HEP including: diaphragmatic breathing and figure 4 stretch. Patient articulated understanding and returned demonstration. Patient will benefit from further education in order to maximize compliance and understanding for long-term therapeutic gains.  TREATMENT Neuromuscular Re-education: Patient educated on primary functions of the pelvic floor including: posture/balance, sexual pleasure, storage and elimination of waste from the body, abdominal cavity closure, and breath coordination. Patient education on effects of pregnancy on posture and mm around the hips/pelvis.     Objective measurements completed on examination: See above findings.         PT Long  Term Goals - 10/10/19 1749      PT LONG TERM GOAL #1   Title  Patient will demonstrate independence with HEP in order to maximize therapeutic gains and improve carryover from physical therapy sessions to ADLs in the home and community.    Baseline  IE: not demonstrated    Time  12    Period  Weeks    Status  New    Target Date  01/02/20      PT LONG TERM GOAL #2   Title  Patient will demonstrate independent and coordinated diaphragmatic breathing in supine with a 1:2 breathing pattern for improved down-regulation of the nervous system and improved management of  intra-abdominal pressures in order to increase function at home and in the community.    Baseline  IE: not demonstrated    Time  12    Period  Weeks    Status  New    Target Date  01/02/20      PT LONG TERM GOAL #3   Title  Patient will decrease worst pain as reported on NPRS by at least 2 points to demonstrate clinically significant reduction in pain in order to restore/improve function and overall QOL.    Baseline  IE: 8/10    Time  12    Period  Weeks    Status  New    Target Date  01/02/20      PT LONG TERM GOAL #4   Title  Patient will demonstrate and articulate appropriate body mechanics for lifts and transfers in order to decrease aggravation of pain and participate safely in work and home duties.    Baseline  IE: not demonstrated    Time  12    Period  Weeks    Status  New    Target Date  01/02/20             Plan - 10/10/19 1605    Clinical Impression Statement  Patient is a 29 year old presenting to clinic with chief complaints of R hip and low back pain. Upon examination, patient demonstrates deficits in posture, pain, lumbar range of motion, RLE strength as evidenced by increased anterior tilt of pelvis in standing, lumbar flexion of 40 degrees, 8-9/10 worst pain, RLE gross strength 4/5, positive R slump test. Patient's responses on FOTO outcome measures (25) indicate moderate to minima functional limitations/disability/distress. Patient's progress may be limited due to the progressive nature of pregnancy; however, patient's motivation and health literacy is advantageous. Patient was able to achieve basic understanding of postural adaptations that occur during pregnancy during today's evaluation and responded positively to educational interventions. Patient will benefit from continued skilled therapeutic intervention to address deficits in posture, pain, lumbar range of motion, RLE strength in order to increase function, and improve overall QOL.    Personal Factors and  Comorbidities  Age;Education;Behavior Pattern;Comorbidity 2;Fitness;Time since onset of injury/illness/exacerbation;Past/Current Experience;Profession    Comorbidities  anxiety, depression    Examination-Activity Limitations  Sit;Transfers;Bed Mobility;Bend;Lift;Squat;Locomotion Level;Continence;Stand;Stairs;Carry;Sleep    Examination-Participation Restrictions  Interpersonal Relationship;Yard Work;Cleaning;Laundry;Community Activity;Meal Prep;Shop    Stability/Clinical Decision Making  Evolving/Moderate complexity    Clinical Decision Making  Moderate    Rehab Potential  Fair    PT Frequency  1x / week    PT Duration  12 weeks    PT Treatment/Interventions  ADLs/Self Care Home Management;Moist Heat;Cryotherapy;Electrical Stimulation;Therapeutic activities;Functional mobility training;Stair training;Gait training;Therapeutic exercise;Balance training;Neuromuscular re-education;Taping;Manual techniques;Patient/family education;Dry needling;Spinal Manipulations;Joint Manipulations;Passive range of motion    PT Next Visit  Plan  External PFM assessment; manual to R hip/lumbar;    PT Home Exercise Plan  diaphragmatic breathing; figure 4 stretch    Consulted and Agree with Plan of Care  Patient       Patient will benefit from skilled therapeutic intervention in order to improve the following deficits and impairments:  Abnormal gait, Decreased balance, Decreased endurance, Decreased mobility, Difficulty walking, Increased muscle spasms, Pain, Postural dysfunction, Improper body mechanics, Decreased range of motion, Decreased activity tolerance, Decreased coordination, Decreased strength  Visit Diagnosis: Pain in right hip  Muscle weakness (generalized)  Abnormal posture     Problem List Patient Active Problem List   Diagnosis Date Noted  . Encounter for supervision of low-risk pregnancy, antepartum 06/22/2019  . Herpes simplex 11/23/2018   Sheria Lang PT, DPT [phone] 10/10/2019, 5:58  PM  [facility] [address] [place], [place], [address] Phone: [phone]   Fax:  [phone]  Name: Maryl Blalock MRN: [id number] Date of Birth: Sep 19, 1990

## 2019-10-11 NOTE — Addendum Note (Signed)
Addended by: Kathryne Eriksson on: 10/11/2019 12:57 PM   Modules accepted: Orders

## 2019-10-15 ENCOUNTER — Ambulatory Visit: Payer: BC Managed Care – PPO | Admitting: Physical Therapy

## 2019-10-15 ENCOUNTER — Other Ambulatory Visit: Payer: Self-pay

## 2019-10-15 ENCOUNTER — Encounter: Payer: Self-pay | Admitting: Physical Therapy

## 2019-10-15 DIAGNOSIS — R293 Abnormal posture: Secondary | ICD-10-CM

## 2019-10-15 DIAGNOSIS — M25551 Pain in right hip: Secondary | ICD-10-CM

## 2019-10-15 DIAGNOSIS — M6281 Muscle weakness (generalized): Secondary | ICD-10-CM

## 2019-10-15 NOTE — Therapy (Signed)
[facility] [address] [place], [place], [address] Phone: [phone]   Fax:  [phone]  Physical Therapy Treatment  Patient Details  Name: Carmen Keller MRN: [id number] Date of Birth: 12-Feb-1991 Referring Provider (PT): Vena Austria   Encounter Date: 10/15/2019  PT End of Session - 10/15/19 1110    Visit Number  2    Number of Visits  12    Date for PT Re-Evaluation  01/02/20    PT Start Time  1100    PT Stop Time  1155    PT Time Calculation (min)  55 min    Activity Tolerance  Patient tolerated treatment well    Behavior During Therapy  [facility] for tasks assessed/performed       Past Medical History:  Diagnosis Date  . Allergy   . Anxiety   . Depression     Past Surgical History:  Procedure Laterality Date  . KNEE SURGERY      There were no vitals filed for this visit.  Subjective Assessment - 10/15/19 1101    Subjective  Patient notes that on Thursday when she worked her shift, her back was okay and didn't deal with any RLE numbness or back pain. Patient notes that yesterday she felt awful. She did attend a baby shower and go on a walking trail which felt fine, but afterwards when she had gotten home and bathed, she noticed how much pain/discomfort she was in with her RLE and back. Patient notes that she was sitting on her couch and had intense pain when she went to stand up.    Currently in Pain?  No/denies       TREATMENT  Pre-treatment assessment: Mild L pelvic obliquity  Manual Therapy: STM and TPR performed to R hamstring and gluteal complex to allow for decreased tension and pain and improved posture and function using vibratory/percussive insturment R innominate posterior mobilizations for decreased spasm and improved mobility, grade II/III Sacral mobilizations with movement for decreased spasm and improved mobility, grade II/III  Neuromuscular Re-education: Supine posterior pelvic tilts  with hip adduction squeeze and coordinated breath for improved postural awareness Standing postural endurance with towel roll feedback and coordinated breathwork Mini Wall squat with postural correction (GTB) with coordinated breath for improved postural awareness Patient education on grading activity and postural variation during pregnancy which can impact pain and discomfort at lumbopelvic region    Patient educated throughout session on appropriate technique and form using multi-modal cueing, HEP, and activity modification. Patient articulated understanding and returned demonstration.  Patient Response to interventions: Comfortable with addition to HEP.   ASSESSMENT Patient presents to clinic with excellent motivation to participate in therapy. Patient demonstrates deficits in posture, pain, lumbar range of motion, RLE strength. Patient able to achieve excellent TrA contraction in standing with feedback during today's session and responded positively to manual and active interventions. Patient will benefit from continued skilled therapeutic intervention to address remaining deficits in posture, pain, lumbar range of motion, RLE strength in order to increase function and improve overall QOL.     PT Long Term Goals - 10/10/19 1749      PT LONG TERM GOAL #1   Title  Patient will demonstrate independence with HEP in order to maximize therapeutic gains and improve carryover from physical therapy sessions to ADLs in the home and community.    Baseline  IE: not demonstrated    Time  12    Period  Weeks  Status  New    Target Date  01/02/20      PT LONG TERM GOAL #2   Title  Patient will demonstrate independent and coordinated diaphragmatic breathing in supine with a 1:2 breathing pattern for improved down-regulation of the nervous system and improved management of intra-abdominal pressures in order to increase function at home and in the community.    Baseline  IE: not demonstrated     Time  12    Period  Weeks    Status  New    Target Date  01/02/20      PT LONG TERM GOAL #3   Title  Patient will decrease worst pain as reported on NPRS by at least 2 points to demonstrate clinically significant reduction in pain in order to restore/improve function and overall QOL.    Baseline  IE: 8/10    Time  12    Period  Weeks    Status  New    Target Date  01/02/20      PT LONG TERM GOAL #4   Title  Patient will demonstrate and articulate appropriate body mechanics for lifts and transfers in order to decrease aggravation of pain and participate safely in work and home duties.    Baseline  IE: not demonstrated    Time  12    Period  Weeks    Status  New    Target Date  01/02/20            Plan - 10/15/19 1111    Clinical Impression Statement  Patient presents to clinic with excellent motivation to participate in therapy. Patient demonstrates deficits in posture, pain, lumbar range of motion, RLE strength. Patient able to achieve excellent TrA contraction in standing with feedback during today's session and responded positively to manual and active interventions. Patient will benefit from continued skilled therapeutic intervention to address remaining deficits in posture, pain, lumbar range of motion, RLE strength in order to increase function and improve overall QOL.    Personal Factors and Comorbidities  Age;Education;Behavior Pattern;Comorbidity 2;Fitness;Time since onset of injury/illness/exacerbation;Past/Current Experience;Profession    Comorbidities  anxiety, depression    Examination-Activity Limitations  Sit;Transfers;Bed Mobility;Bend;Lift;Squat;Locomotion Level;Continence;Stand;Stairs;Carry;Sleep    Examination-Participation Restrictions  Interpersonal Relationship;Yard Work;Cleaning;Laundry;Community Activity;Meal Prep;Shop    Stability/Clinical Decision Making  Evolving/Moderate complexity    Rehab Potential  Fair    PT Frequency  1x / week    PT Duration  12  weeks    PT Treatment/Interventions  ADLs/Self Care Home Management;Moist Heat;Cryotherapy;Electrical Stimulation;Therapeutic activities;Functional mobility training;Stair training;Gait training;Therapeutic exercise;Balance training;Neuromuscular re-education;Taping;Manual techniques;Patient/family education;Dry needling;Spinal Manipulations;Joint Manipulations;Passive range of motion    PT Next Visit Plan  External PFM assessment; manual to R hip/lumbar;    PT Home Exercise Plan  diaphragmatic breathing; figure 4 stretch    Consulted and Agree with Plan of Care  Patient       Patient will benefit from skilled therapeutic intervention in order to improve the following deficits and impairments:  Abnormal gait, Decreased balance, Decreased endurance, Decreased mobility, Difficulty walking, Increased muscle spasms, Pain, Postural dysfunction, Improper body mechanics, Decreased range of motion, Decreased activity tolerance, Decreased coordination, Decreased strength  Visit Diagnosis: Pain in right hip  Muscle weakness (generalized)  Abnormal posture     Problem List Patient Active Problem List   Diagnosis Date Noted  . Encounter for supervision of low-risk pregnancy, antepartum 06/22/2019  . Herpes simplex 11/23/2018   Sheria Lang PT, DPT [phone] 10/15/2019, 12:33 PM  [facility] [address] [place], [place], [address] Phone: [phone]   Fax:  [phone]  Name: Carmen Keller MRN: [id number] Date of Birth: [birth year]

## 2019-10-24 ENCOUNTER — Other Ambulatory Visit: Payer: BC Managed Care – PPO

## 2019-10-24 ENCOUNTER — Ambulatory Visit (INDEPENDENT_AMBULATORY_CARE_PROVIDER_SITE_OTHER): Payer: BC Managed Care – PPO | Admitting: Obstetrics

## 2019-10-24 ENCOUNTER — Ambulatory Visit: Payer: BC Managed Care – PPO | Admitting: Physical Therapy

## 2019-10-24 ENCOUNTER — Other Ambulatory Visit: Payer: Self-pay

## 2019-10-24 VITALS — BP 118/74 | Wt 171.0 lb

## 2019-10-24 DIAGNOSIS — Z3A27 27 weeks gestation of pregnancy: Secondary | ICD-10-CM

## 2019-10-24 DIAGNOSIS — Z3490 Encounter for supervision of normal pregnancy, unspecified, unspecified trimester: Secondary | ICD-10-CM

## 2019-10-24 NOTE — Progress Notes (Signed)
28 week labs today. No vb. No lof. Still having heartburn

## 2019-10-24 NOTE — Progress Notes (Signed)
  Routine Prenatal Care Visit  Subjective  Carmen Keller is a 29 y.o. G1P0 at [redacted]w[redacted]d being seen today for ongoing prenatal care.  She is currently monitored for the following issues for this low-risk pregnancy and has Encounter for supervision of low-risk pregnancy, antepartum and Herpes simplex on their problem list.  ----------------------------------------------------------------------------------- Patient reports some ongoing sciatica which she has addressed with chiropractic, PR, and limited flexeril..   Contractions: Not present. Vag. Bleeding: None.  Movement: Present. Leaking Fluid denies.  ----------------------------------------------------------------------------------- The following portions of the patient's history were reviewed and updated as appropriate: allergies, current medications, past family history, past medical history, past social history, past surgical history and problem list. Problem list updated.  Objective  Blood pressure 118/74, weight 171 lb (77.6 kg), last menstrual period 03/06/2019. Pregravid weight 137 lb (62.1 kg) Total Weight Gain 34 lb (15.4 kg) Urinalysis: Urine Protein    Urine Glucose    Fetal Status:     Movement: Present     General:  Alert, oriented and cooperative. Patient is in no acute distress.  Skin: Skin is warm and dry. No rash noted.   Cardiovascular: Normal heart rate noted  Respiratory: Normal respiratory effort, no problems with respiration noted  Abdomen: Soft, gravid, appropriate for gestational age. Pain/Pressure: Absent     Pelvic:  Cervical exam deferred        Extremities: Normal range of motion.     Mental Status: Normal mood and affect. Normal behavior. Normal judgment and thought content.   Assessment   29 y.o. G1P0 at [redacted]w[redacted]d by  01/23/2020, by Ultrasound presenting for routine prenatal visit  Plan   pregnancy1 Problems (from 03/06/19 to present)    Problem Noted Resolved   Encounter for supervision of low-risk  pregnancy, antepartum 06/22/2019 by Nadara Mustard, MD No   Overview Addendum 09/03/2019  3:53 PM by Vena Austria, MD    Clinic [facility] Prenatal Labs  Dating 9 week [place] Blood type: B/Positive/-- (02/12 1537)   Genetic Screen NIPS: Normal XX, Inheritest: CF neg, Fragile-X neg, SMA neg Antibody:Negative (02/12 1537)  Anatomic [place] Complete Rubella: <0.90 (02/12 1537) Varicella: Immune  GTT Third trimester:  RPR: Non Reactive (02/12 1537)   Rhogam N/A HBsAg: Negative (02/12 1537)   TDaP vaccine      Flu Shot: 01/2019 HIV: Non Reactive (02/12 1537)   Baby Food                                GBS:   Contraception  Pap:06/22/19 NIL  CBB  No   CS/VBAC n/a   Support Person BF [name]           Previous Version       Preterm labor symptoms and general obstetric precautions including but not limited to vaginal bleeding, contractions, leaking of fluid and fetal movement were reviewed in detail with the patient. Please refer to After Visit Summary for other counseling recommendations.  Having her 28 weeks labs drawn today.  Return in about 2 weeks (around 11/07/2019) for return OB.  Mirna Mires, CNM  10/24/2019 9:40 AM

## 2019-10-25 ENCOUNTER — Other Ambulatory Visit: Payer: Self-pay | Admitting: Obstetrics

## 2019-10-25 DIAGNOSIS — Z3490 Encounter for supervision of normal pregnancy, unspecified, unspecified trimester: Secondary | ICD-10-CM

## 2019-10-25 DIAGNOSIS — R7309 Other abnormal glucose: Secondary | ICD-10-CM

## 2019-10-25 LAB — 28 WEEK RH+PANEL
Basophils Absolute: 0 10*3/uL (ref 0.0–0.2)
Basos: 0 %
EOS (ABSOLUTE): 0.1 10*3/uL (ref 0.0–0.4)
Eos: 2 %
Gestational Diabetes Screen: 147 mg/dL — ABNORMAL HIGH (ref 65–139)
HIV Screen 4th Generation wRfx: NONREACTIVE
Hematocrit: 37.2 % (ref 34.0–46.6)
Hemoglobin: 12.2 g/dL (ref 11.1–15.9)
Immature Grans (Abs): 0 10*3/uL (ref 0.0–0.1)
Immature Granulocytes: 1 %
Lymphocytes Absolute: 2.1 10*3/uL (ref 0.7–3.1)
Lymphs: 29 %
MCH: 31.8 pg (ref 26.6–33.0)
MCHC: 32.8 g/dL (ref 31.5–35.7)
MCV: 97 fL (ref 79–97)
Monocytes Absolute: 0.4 10*3/uL (ref 0.1–0.9)
Monocytes: 6 %
Neutrophils Absolute: 4.6 10*3/uL (ref 1.4–7.0)
Neutrophils: 62 %
Platelets: 223 10*3/uL (ref 150–450)
RBC: 3.84 x10E6/uL (ref 3.77–5.28)
RDW: 11.9 % (ref 11.7–15.4)
RPR Ser Ql: NONREACTIVE
WBC: 7.3 10*3/uL (ref 3.4–10.8)

## 2019-10-25 NOTE — Progress Notes (Signed)
Elevated Gucose level with her 1hrGTT Patient is contacted via MY chart and with a voicemail. Order for the 3 hr GTT is placed in her chart.  Mirna Mires, CNM  10/25/2019 12:11 PM

## 2019-10-26 ENCOUNTER — Encounter: Payer: BC Managed Care – PPO | Admitting: Obstetrics and Gynecology

## 2019-10-26 ENCOUNTER — Other Ambulatory Visit: Payer: BC Managed Care – PPO

## 2019-10-29 ENCOUNTER — Encounter: Payer: Self-pay | Admitting: Physical Therapy

## 2019-10-29 ENCOUNTER — Other Ambulatory Visit: Payer: Self-pay

## 2019-10-29 ENCOUNTER — Other Ambulatory Visit: Payer: Self-pay | Admitting: Advanced Practice Midwife

## 2019-10-29 ENCOUNTER — Ambulatory Visit: Payer: BC Managed Care – PPO | Admitting: Physical Therapy

## 2019-10-29 DIAGNOSIS — R293 Abnormal posture: Secondary | ICD-10-CM

## 2019-10-29 DIAGNOSIS — M25551 Pain in right hip: Secondary | ICD-10-CM | POA: Diagnosis not present

## 2019-10-29 DIAGNOSIS — O26892 Other specified pregnancy related conditions, second trimester: Secondary | ICD-10-CM

## 2019-10-29 DIAGNOSIS — M6281 Muscle weakness (generalized): Secondary | ICD-10-CM

## 2019-10-29 MED ORDER — OMEPRAZOLE 20 MG PO CPDR: 20.0000 mg | DELAYED_RELEASE_CAPSULE | Freq: Every day | ORAL | 11 refills | Status: AC

## 2019-10-29 NOTE — Progress Notes (Unsigned)
Rx prilosec sent for patient. TUMS and pepcid not working.

## 2019-10-30 ENCOUNTER — Other Ambulatory Visit: Payer: Self-pay | Admitting: Obstetrics and Gynecology

## 2019-10-30 ENCOUNTER — Other Ambulatory Visit: Payer: BC Managed Care – PPO

## 2019-10-30 DIAGNOSIS — R7309 Other abnormal glucose: Secondary | ICD-10-CM

## 2019-10-30 DIAGNOSIS — Z3490 Encounter for supervision of normal pregnancy, unspecified, unspecified trimester: Secondary | ICD-10-CM

## 2019-10-30 NOTE — Therapy (Signed)
[facility] [address] [place], [place], [address] Phone: [phone]   Fax:  [phone]  Physical Therapy Treatment  Patient Details  Name: Carmen Keller MRN: [id number] Date of Birth: 02-25-1991 Referring Provider (PT): Malachy Mood   Encounter Date: 10/29/2019   PT End of Session - 10/30/19 0836    Visit Number 3    Number of Visits 12    Date for PT Re-Evaluation 01/02/20    PT Start Time 6962    PT Stop Time 1751    PT Time Calculation (min) 53 min    Activity Tolerance Patient tolerated treatment well    Behavior During Therapy [facility] for tasks assessed/performed           Past Medical History:  Diagnosis Date  . Allergy   . Anxiety   . Depression     Past Surgical History:  Procedure Laterality Date  . KNEE SURGERY      There were no vitals filed for this visit.   Subjective Assessment - 10/29/19 1659    Subjective Patient notes that she is losing her appetite because of dealing with heartburn but has a new order for prilosec that she is hoping will help. Patient adds that work has been okay as she has had a lot more independent patients which has helped with body mechanics. Patient also has split up her 3-12 hour shifts which is giving her back a break. Patient continues to have sharp pain on R side; this is worst when laying down (7-8/10). This pain takes 5-10 minutes to come down once settled in new position. Patient states that she feels the radiating pain downthe R leg has eased off.    Currently in Pain? No/denies           TREATMENT  Pre-treatment assessment: Mild L pelvic obliquity  Manual Therapy: R innominate posterior mobilizations for decreased spasm and improved mobility, grade II/III Sacral mobilizations with movement for decreased spasm and improved mobility, grade II/III  Neuromuscular Re-education: Sidelying PFM coordination with breath for decreased PFM spasm/discomfort  and improved motor control Patient education on body mechanics for sleeping position to alleviate pain/discomfort at R hip and with acid-reflux.  Seated heel squeezes for posterior pelvic force closure to improve pain/discomfort Patient education on exhalation with transfers/bed mobility for improved IAP management and decreased pain.  Patient educated throughout session on appropriate technique and form using multi-modal cueing, HEP, and activity modification. Patient articulated understanding and returned demonstration.  Patient Response to interventions: Comfortable with additions to HEP.   ASSESSMENT Patient presents to clinic with excellent motivation to participate in therapy. Patient demonstrates deficits in posture, pain, lumbar range of motion, RLE strength. Patient able to achieve excellent PFM activity with verbal and tactile cue during today's session and responded positively to manual and active interventions. Patient will benefit from continued skilled therapeutic intervention to address remaining deficits in posture, pain, lumbar range of motion, RLE strength in order to increase function and improve overall QOL.      PT Long Term Goals - 10/10/19 1749      PT LONG TERM GOAL #1   Title Patient will demonstrate independence with HEP in order to maximize therapeutic gains and improve carryover from physical therapy sessions to ADLs in the home and community.    Baseline IE: not demonstrated    Time 12    Period Weeks    Status New    Target Date 01/02/20  PT LONG TERM GOAL #2   Title Patient will demonstrate independent and coordinated diaphragmatic breathing in supine with a 1:2 breathing pattern for improved down-regulation of the nervous system and improved management of intra-abdominal pressures in order to increase function at home and in the community.    Baseline IE: not demonstrated    Time 12    Period Weeks    Status New    Target Date 01/02/20      PT  LONG TERM GOAL #3   Title Patient will decrease worst pain as reported on NPRS by at least 2 points to demonstrate clinically significant reduction in pain in order to restore/improve function and overall QOL.    Baseline IE: 8/10    Time 12    Period Weeks    Status New    Target Date 01/02/20      PT LONG TERM GOAL #4   Title Patient will demonstrate and articulate appropriate body mechanics for lifts and transfers in order to decrease aggravation of pain and participate safely in work and home duties.    Baseline IE: not demonstrated    Time 12    Period Weeks    Status New    Target Date 01/02/20                 Plan - 10/30/19 0837    Clinical Impression Statement Patient presents to clinic with excellent motivation to participate in therapy. Patient demonstrates deficits in posture, pain, lumbar range of motion, RLE strength. Patient able to achieve excellent PFM activity with verbal and tactile cue during today's session and responded positively to manual and active interventions. Patient will benefit from continued skilled therapeutic intervention to address remaining deficits in posture, pain, lumbar range of motion, RLE strength in order to increase function and improve overall QOL.    Personal Factors and Comorbidities Age;Education;Behavior Pattern;Comorbidity 2;Fitness;Time since onset of injury/illness/exacerbation;Past/Current Experience;Profession    Comorbidities anxiety, depression    Examination-Activity Limitations Sit;Transfers;Bed Mobility;Bend;Lift;Squat;Locomotion Level;Continence;Stand;Stairs;Carry;Sleep    Examination-Participation Restrictions Interpersonal Relationship;Yard Work;Cleaning;Laundry;Community Activity;Meal Prep;Shop    Stability/Clinical Decision Making Evolving/Moderate complexity    Rehab Potential Fair    PT Frequency 1x / week    PT Duration 12 weeks    PT Treatment/Interventions ADLs/Self Care Home Management;Moist  Heat;Cryotherapy;Electrical Stimulation;Therapeutic activities;Functional mobility training;Stair training;Gait training;Therapeutic exercise;Balance training;Neuromuscular re-education;Taping;Manual techniques;Patient/family education;Dry needling;Spinal Manipulations;Joint Manipulations;Passive range of motion    PT Next Visit Plan manual PRN    PT Home Exercise Plan diaphragmatic breathing; figure 4 stretch    Consulted and Agree with Plan of Care Patient           Patient will benefit from skilled therapeutic intervention in order to improve the following deficits and impairments:  Abnormal gait, Decreased balance, Decreased endurance, Decreased mobility, Difficulty walking, Increased muscle spasms, Pain, Postural dysfunction, Improper body mechanics, Decreased range of motion, Decreased activity tolerance, Decreased coordination, Decreased strength  Visit Diagnosis: Pain in right hip  Muscle weakness (generalized)  Abnormal posture     Problem List Patient Active Problem List   Diagnosis Date Noted  . Encounter for supervision of low-risk pregnancy, antepartum 06/22/2019  . Herpes simplex 11/23/2018   Sheria Lang PT, DPT [phone] 10/30/2019, 8:48 AM  [facility] [address] [place], [place], [address] Phone: [phone]   Fax:  [phone]  Name: Carmen Keller MRN: [id number] Date of Birth: [birth year]

## 2019-10-31 LAB — GESTATIONAL GLUCOSE TOLERANCE
Glucose, Fasting: 85 mg/dL (ref 65–94)
Glucose, GTT - 1 Hour: 165 mg/dL (ref 65–179)
Glucose, GTT - 2 Hour: 149 mg/dL (ref 65–154)
Glucose, GTT - 3 Hour: 92 mg/dL (ref 65–139)

## 2019-11-06 ENCOUNTER — Ambulatory Visit: Payer: BC Managed Care – PPO | Admitting: Physical Therapy

## 2019-11-07 ENCOUNTER — Other Ambulatory Visit: Payer: Self-pay

## 2019-11-07 ENCOUNTER — Ambulatory Visit (INDEPENDENT_AMBULATORY_CARE_PROVIDER_SITE_OTHER): Payer: BC Managed Care – PPO | Admitting: Obstetrics

## 2019-11-07 VITALS — BP 110/80 | Wt 172.0 lb

## 2019-11-07 DIAGNOSIS — Z3490 Encounter for supervision of normal pregnancy, unspecified, unspecified trimester: Secondary | ICD-10-CM

## 2019-11-07 DIAGNOSIS — Z23 Encounter for immunization: Secondary | ICD-10-CM | POA: Diagnosis not present

## 2019-11-07 DIAGNOSIS — Z3A29 29 weeks gestation of pregnancy: Secondary | ICD-10-CM

## 2019-11-07 LAB — POCT URINALYSIS DIPSTICK OB
Glucose, UA: NEGATIVE
POC,PROTEIN,UA: NEGATIVE

## 2019-11-07 NOTE — Progress Notes (Signed)
ROB at 29 weeks. No complaints- Doing well, with ample fetal movement and denies any LOF, vaginal bleeding or contractions. 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 Using PT. Maternity belt suggested. PRN Flexeril. A: IUP 29 weeks        S=D P: RTC 2 weeks.  Mirna Mires, CNM  11/07/2019 11:20 AM

## 2019-11-07 NOTE — Progress Notes (Signed)
ROB/TDap and BT consent today- no concerns

## 2019-11-07 NOTE — Patient Instructions (Signed)
Third Trimester of Pregnancy The third trimester is from week 28 through week 40 (months 7 through 9). The third trimester is a time when the unborn baby (fetus) is growing rapidly. At the end of the ninth month, the fetus is about 20 inches in length and weighs 6-10 pounds. Body changes during your third trimester Your body will continue to go through many changes during pregnancy. The changes vary from woman to woman. During the third trimester:  Your weight will continue to increase. You can expect to gain 25-35 pounds (11-16 kg) by the end of the pregnancy.  You may begin to get stretch marks on your hips, abdomen, and breasts.  You may urinate more often because the fetus is moving lower into your pelvis and pressing on your bladder.  You may develop or continue to have heartburn. This is caused by increased hormones that slow down muscles in the digestive tract.  You may develop or continue to have constipation because increased hormones slow digestion and cause the muscles that push waste through your intestines to relax.  You may develop hemorrhoids. These are swollen veins (varicose veins) in the rectum that can itch or be painful.  You may develop swollen, bulging veins (varicose veins) in your legs.  You may have increased body aches in the pelvis, back, or thighs. This is due to weight gain and increased hormones that are relaxing your joints.  You may have changes in your hair. These can include thickening of your hair, rapid growth, and changes in texture. Some women also have hair loss during or after pregnancy, or hair that feels dry or thin. Your hair will most likely return to normal after your baby is born.  Your breasts will continue to grow and they will continue to become tender. A yellow fluid (colostrum) may leak from your breasts. This is the first milk you are producing for your baby.  Your belly button may stick out.  You may notice more swelling in your hands,  face, or ankles.  You may have increased tingling or numbness in your hands, arms, and legs. The skin on your belly may also feel numb.  You may feel short of breath because of your expanding uterus.  You may have more problems sleeping. This can be caused by the size of your belly, increased need to urinate, and an increase in your body's metabolism.  You may notice the fetus "dropping," or moving lower in your abdomen (lightening).  You may have increased vaginal discharge.  You may notice your joints feel loose and you may have pain around your pelvic bone. What to expect at prenatal visits You will have prenatal exams every 2 weeks until week 36. Then you will have weekly prenatal exams. During a routine prenatal visit:  You will be weighed to make sure you and the baby are growing normally.  Your blood pressure will be taken.  Your abdomen will be measured to track your baby's growth.  The fetal heartbeat will be listened to.  Any test results from the previous visit will be discussed.  You may have a cervical check near your due date to see if your cervix has softened or thinned (effaced).  You will be tested for Group B streptococcus. This happens between 35 and 37 weeks. Your health care provider may ask you:  What your birth plan is.  How you are feeling.  If you are feeling the baby move.  If you have had any abnormal   symptoms, such as leaking fluid, bleeding, severe headaches, or abdominal cramping.  If you are using any tobacco products, including cigarettes, chewing tobacco, and electronic cigarettes.  If you have any questions. Other tests or screenings that may be performed during your third trimester include:  Blood tests that check for low iron levels (anemia).  Fetal testing to check the health, activity level, and growth of the fetus. Testing is done if you have certain medical conditions or if there are problems during the pregnancy.  Nonstress test  (NST). This test checks the health of your baby to make sure there are no signs of problems, such as the baby not getting enough oxygen. During this test, a belt is placed around your belly. The baby is made to move, and its heart rate is monitored during movement. What is false labor? False labor is a condition in which you feel small, irregular tightenings of the muscles in the womb (contractions) that usually go away with rest, changing position, or drinking water. These are called Braxton Hicks contractions. Contractions may last for hours, days, or even weeks before true labor sets in. If contractions come at regular intervals, become more frequent, increase in intensity, or become painful, you should see your health care provider. What are the signs of labor?  Abdominal cramps.  Regular contractions that start at 10 minutes apart and become stronger and more frequent with time.  Contractions that start on the top of the uterus and spread down to the lower abdomen and back.  Increased pelvic pressure and dull back pain.  A watery or bloody mucus discharge that comes from the vagina.  Leaking of amniotic fluid. This is also known as your "water breaking." It could be a slow trickle or a gush. Let your health care provider know if it has a color or strange odor. If you have any of these signs, call your health care provider right away, even if it is before your due date. Follow these instructions at home: Medicines  Follow your health care provider's instructions regarding medicine use. Specific medicines may be either safe or unsafe to take during pregnancy.  Take a prenatal vitamin that contains at least 600 micrograms (mcg) of folic acid.  If you develop constipation, try taking a stool softener if your health care provider approves. Eating and drinking   Eat a balanced diet that includes fresh fruits and vegetables, whole grains, good sources of protein such as meat, eggs, or tofu,  and low-fat dairy. Your health care provider will help you determine the amount of weight gain that is right for you.  Avoid raw meat and uncooked cheese. These carry germs that can cause birth defects in the baby.  If you have low calcium intake from food, talk to your health care provider about whether you should take a daily calcium supplement.  Eat four or five small meals rather than three large meals a day.  Limit foods that are high in fat and processed sugars, such as fried and sweet foods.  To prevent constipation: ? Drink enough fluid to keep your urine clear or pale yellow. ? Eat foods that are high in fiber, such as fresh fruits and vegetables, whole grains, and beans. Activity  Exercise only as directed by your health care provider. Most women can continue their usual exercise routine during pregnancy. Try to exercise for 30 minutes at least 5 days a week. Stop exercising if you experience uterine contractions.  Avoid heavy lifting.  Do   not exercise in extreme heat or humidity, or at high altitudes.  Wear low-heel, comfortable shoes.  Practice good posture.  You may continue to have sex unless your health care provider tells you otherwise. Relieving pain and discomfort  Take frequent breaks and rest with your legs elevated if you have leg cramps or low back pain.  Take warm sitz baths to soothe any pain or discomfort caused by hemorrhoids. Use hemorrhoid cream if your health care provider approves.  Wear a good support bra to prevent discomfort from breast tenderness.  If you develop varicose veins: ? Wear support pantyhose or compression stockings as told by your healthcare provider. ? Elevate your feet for 15 minutes, 3-4 times a day. Prenatal care  Write down your questions. Take them to your prenatal visits.  Keep all your prenatal visits as told by your health care provider. This is important. Safety  Wear your seat belt at all times when driving.  Make  a list of emergency phone numbers, including numbers for family, friends, the hospital, and police and fire departments. General instructions  Avoid cat litter boxes and soil used by cats. These carry germs that can cause birth defects in the baby. If you have a cat, ask someone to clean the litter box for you.  Do not travel far distances unless it is absolutely necessary and only with the approval of your health care provider.  Do not use hot tubs, steam rooms, or saunas.  Do not drink alcohol.  Do not use any products that contain nicotine or tobacco, such as cigarettes and e-cigarettes. If you need help quitting, ask your health care provider.  Do not use any medicinal herbs or unprescribed drugs. These chemicals affect the formation and growth of the baby.  Do not douche or use tampons or scented sanitary pads.  Do not cross your legs for long periods of time.  To prepare for the arrival of your baby: ? Take prenatal classes to understand, practice, and ask questions about labor and delivery. ? Make a trial run to the hospital. ? Visit the hospital and tour the maternity area. ? Arrange for maternity or paternity leave through employers. ? Arrange for family and friends to take care of pets while you are in the hospital. ? Purchase a rear-facing car seat and make sure you know how to install it in your car. ? Pack your hospital bag. ? Prepare the baby's nursery. Make sure to remove all pillows and stuffed animals from the baby's crib to prevent suffocation.  Visit your dentist if you have not gone during your pregnancy. Use a soft toothbrush to brush your teeth and be gentle when you floss. Contact a health care provider if:  You are unsure if you are in labor or if your water has broken.  You become dizzy.  You have mild pelvic cramps, pelvic pressure, or nagging pain in your abdominal area.  You have lower back pain.  You have persistent nausea, vomiting, or  diarrhea.  You have an unusual or bad smelling vaginal discharge.  You have pain when you urinate. Get help right away if:  Your water breaks before 37 weeks.  You have regular contractions less than 5 minutes apart before 37 weeks.  You have a fever.  You are leaking fluid from your vagina.  You have spotting or bleeding from your vagina.  You have severe abdominal pain or cramping.  You have rapid weight loss or weight gain.  You have   shortness of breath with chest pain.  You notice sudden or extreme swelling of your face, hands, ankles, feet, or legs.  Your baby makes fewer than 10 movements in 2 hours.  You have severe headaches that do not go away when you take medicine.  You have vision changes. Summary  The third trimester is from week 28 through week 40, months 7 through 9. The third trimester is a time when the unborn baby (fetus) is growing rapidly.  During the third trimester, your discomfort may increase as you and your baby continue to gain weight. You may have abdominal, leg, and back pain, sleeping problems, and an increased need to urinate.  During the third trimester your breasts will keep growing and they will continue to become tender. A yellow fluid (colostrum) may leak from your breasts. This is the first milk you are producing for your baby.  False labor is a condition in which you feel small, irregular tightenings of the muscles in the womb (contractions) that eventually go away. These are called Braxton Hicks contractions. Contractions may last for hours, days, or even weeks before true labor sets in.  Signs of labor can include: abdominal cramps; regular contractions that start at 10 minutes apart and become stronger and more frequent with time; watery or bloody mucus discharge that comes from the vagina; increased pelvic pressure and dull back pain; and leaking of amniotic fluid. This information is not intended to replace advice given to you by your  health care provider. Make sure you discuss any questions you have with your health care provider. Document Revised: 08/17/2018 Document Reviewed: 06/01/2016 Elsevier Patient Education  2020 [employer]  

## 2019-11-14 ENCOUNTER — Ambulatory Visit: Payer: BC Managed Care – PPO | Admitting: Physical Therapy

## 2019-11-21 ENCOUNTER — Ambulatory Visit (INDEPENDENT_AMBULATORY_CARE_PROVIDER_SITE_OTHER): Payer: BC Managed Care – PPO | Admitting: Obstetrics

## 2019-11-21 ENCOUNTER — Other Ambulatory Visit: Payer: Self-pay

## 2019-11-21 VITALS — BP 118/74 | Wt 178.0 lb

## 2019-11-21 DIAGNOSIS — G8929 Other chronic pain: Secondary | ICD-10-CM

## 2019-11-21 DIAGNOSIS — M545 Low back pain, unspecified: Secondary | ICD-10-CM

## 2019-11-21 DIAGNOSIS — Z3A31 31 weeks gestation of pregnancy: Secondary | ICD-10-CM

## 2019-11-21 DIAGNOSIS — Z3490 Encounter for supervision of normal pregnancy, unspecified, unspecified trimester: Secondary | ICD-10-CM

## 2019-11-21 DIAGNOSIS — O2600 Excessive weight gain in pregnancy, unspecified trimester: Secondary | ICD-10-CM

## 2019-11-21 MED ORDER — CYCLOBENZAPRINE HCL 10 MG PO TABS: 10.0000 mg | ORAL_TABLET | Freq: Three times a day (TID) | ORAL | 0 refills | Status: DC | PRN

## 2019-11-21 NOTE — Progress Notes (Signed)
  Routine Prenatal Care Visit  Subjective  Carmen Keller is a 29 y.o. G1P0 at [redacted]w[redacted]d being seen today for ongoing prenatal care.  She is currently monitored for the following issues for this low-risk pregnancy and has Encounter for supervision of low-risk pregnancy, antepartum and Herpes simplex on their problem list.  ----------------------------------------------------------------------------------- Patient reports backache.   Contractions: Not present. Vag. Bleeding: None.  Movement: Present. Leaking Fluid denies.  ----------------------------------------------------------------------------------- The following portions of the patient's history were reviewed and updated as appropriate: allergies, current medications, past family history, past medical history, past social history, past surgical history and problem list. Problem list updated.  Objective  Blood pressure 118/74, weight 178 lb (80.7 kg), last menstrual period 03/06/2019. Pregravid weight 137 lb (62.1 kg) Total Weight Gain 41 lb (18.6 kg) Urinalysis: Urine Protein    Urine Glucose    Fetal Status:     Movement: Present     General:  Alert, oriented and cooperative. Patient is in no acute distress.  Skin: Skin is warm and dry. No rash noted.   Cardiovascular: Normal heart rate noted  Respiratory: Normal respiratory effort, no problems with respiration noted  Abdomen: Soft, gravid, appropriate for gestational age. Pain/Pressure: Present     Pelvic:  Cervical exam deferred        Extremities: Normal range of motion.     Mental Status: Normal mood and affect. Normal behavior. Normal judgment and thought content.   Assessment   29 y.o. G1P0 at [redacted]w[redacted]d by  01/23/2020, by Ultrasound presenting for routine prenatal visit Needs refill on her Flexeril  Plan   pregnancy1 Problems (from 03/06/19 to present)    Problem Noted Resolved   Encounter for supervision of low-risk pregnancy, antepartum 06/22/2019 by Nadara Mustard, MD No   Overview Addendum 11/07/2019 11:22 AM by Mirna Mires, CNM    Clinic [facility] Prenatal Labs  Dating 9 week [place] Blood type: B/Positive/-- (02/12 1537)   Genetic Screen NIPS: Normal XX, Inheritest: CF neg, Fragile-X neg, SMA neg Antibody:Negative (02/12 1537)  Anatomic [place] Complete Rubella: <0.90 (02/12 1537) Varicella: Immune  GTT Third trimester: 147 needs 3hr- passed RPR: Non Reactive (02/12 1537)   Rhogam N/A HBsAg: Negative (02/12 1537)   TDaP vaccine      Flu Shot: 01/2019 HIV: Non Reactive (02/12 1537)   Baby Food                        Breast        GBS:   Contraception  Pap:06/22/19 NIL  CBB  No   CS/VBAC n/a   Support Person BF [name]           Previous Version       Preterm labor symptoms and general obstetric precautions including but not limited to vaginal bleeding, contractions, leaking of fluid and fetal movement were reviewed in detail with the patient. Please refer to After Visit Summary for other counseling recommendations.  Flexeril refilled.  Return in about 2 weeks (around 12/05/2019) for return OB.  Mirna Mires, CNM  11/21/2019 3:50 PM

## 2019-11-21 NOTE — Progress Notes (Signed)
Needs flexeril refill. No vb. No lof. Some back pain.

## 2019-11-26 ENCOUNTER — Encounter: Payer: Self-pay | Admitting: Obstetrics and Gynecology

## 2019-11-26 ENCOUNTER — Inpatient Hospital Stay
Admission: EM | Admit: 2019-11-26 | Discharge: 2019-11-29 | DRG: 833 | Disposition: A | Discharge status: Home / Self Care | Payer: BC Managed Care – PPO | Attending: Certified Nurse Midwife | Admitting: Certified Nurse Midwife

## 2019-11-26 ENCOUNTER — Other Ambulatory Visit: Payer: Self-pay

## 2019-11-26 DIAGNOSIS — Z3A31 31 weeks gestation of pregnancy: Secondary | ICD-10-CM

## 2019-11-26 DIAGNOSIS — O4703 False labor before 37 completed weeks of gestation, third trimester: Secondary | ICD-10-CM

## 2019-11-26 DIAGNOSIS — Z20822 Contact with and (suspected) exposure to covid-19: Secondary | ICD-10-CM | POA: Diagnosis present

## 2019-11-26 LAB — ROM PLUS (ARMC ONLY): Rom Plus: NEGATIVE

## 2019-11-26 MED ORDER — LACTATED RINGERS IV BOLUS
500.0000 mL | Freq: Once | INTRAVENOUS | Status: AC
  Administered 2019-11-27: 500 mL via INTRAVENOUS

## 2019-11-26 MED ORDER — BETAMETHASONE SOD PHOS & ACET 6 (3-3) MG/ML IJ SUSP
12.0000 mg | Freq: Once | INTRAMUSCULAR | Status: AC
  Administered 2019-11-27: 12 mg via INTRAMUSCULAR
  Filled 2019-11-26: qty 5

## 2019-11-26 MED ORDER — LACTATED RINGERS IV SOLN: INTRAVENOUS | Status: DC

## 2019-11-26 NOTE — OB Triage Note (Signed)
Pt is a G1PO at 31.5 weeks with c/o pressure and leaking of fluid. At approx 1730 pt had intercourse and had a large gush of clear fluid that ran down her leg. She went to work at [employer] amd has been having constant pressure. +FM FHR 140's

## 2019-11-27 ENCOUNTER — Other Ambulatory Visit: Payer: Self-pay

## 2019-11-27 ENCOUNTER — Encounter: Payer: Self-pay | Admitting: Advanced Practice Midwife

## 2019-11-27 ENCOUNTER — Observation Stay: Payer: BC Managed Care – PPO

## 2019-11-27 DIAGNOSIS — O4703 False labor before 37 completed weeks of gestation, third trimester: Secondary | ICD-10-CM

## 2019-11-27 DIAGNOSIS — Z3A31 31 weeks gestation of pregnancy: Secondary | ICD-10-CM | POA: Diagnosis not present

## 2019-11-27 LAB — CHLAMYDIA/NGC RT PCR (ARMC ONLY)
Chlamydia Tr: NOT DETECTED
N gonorrhoeae: NOT DETECTED

## 2019-11-27 LAB — SARS CORONAVIRUS 2 BY RT PCR (HOSPITAL ORDER, PERFORMED IN ~~LOC~~ HOSPITAL LAB): SARS Coronavirus 2: NEGATIVE

## 2019-11-27 LAB — GROUP B STREP BY PCR: Group B strep by PCR: NEGATIVE

## 2019-11-27 LAB — FETAL FIBRONECTIN: Fetal Fibronectin: POSITIVE — AB

## 2019-11-27 MED ORDER — MAGNESIUM SULFATE 40 GM/1000ML IV SOLN
INTRAVENOUS | Status: AC
  Administered 2019-11-27: 4 g via INTRAVENOUS
  Filled 2019-11-27: qty 1000

## 2019-11-27 MED ORDER — MAGNESIUM SULFATE BOLUS VIA INFUSION
4.0000 g | Freq: Once | INTRAVENOUS | Status: AC
  Filled 2019-11-27: qty 1000

## 2019-11-27 MED ORDER — MAGNESIUM SULFATE 40 GM/1000ML IV SOLN
1.0000 g/h | INTRAVENOUS | Status: DC
  Administered 2019-11-27: 1 g/h via INTRAVENOUS

## 2019-11-27 MED ORDER — ACETAMINOPHEN 500 MG PO TABS
1000.0000 mg | ORAL_TABLET | Freq: Four times a day (QID) | ORAL | Status: DC | PRN
  Administered 2019-11-27: 1000 mg via ORAL
  Filled 2019-11-27: qty 2

## 2019-11-27 MED ORDER — CALCIUM GLUCONATE 10 % IV SOLN
INTRAVENOUS | Status: AC
  Filled 2019-11-27: qty 10

## 2019-11-27 MED ORDER — NIFEDIPINE 10 MG PO CAPS
10.0000 mg | ORAL_CAPSULE | Freq: Four times a day (QID) | ORAL | Status: DC
  Administered 2019-11-27 – 2019-11-29 (×9): 10 mg via ORAL
  Filled 2019-11-27 (×10): qty 1

## 2019-11-27 MED ORDER — MAGNESIUM SULFATE BOLUS VIA INFUSION
4.0000 g | Freq: Once | INTRAVENOUS | Status: DC
  Filled 2019-11-27: qty 1000

## 2019-11-27 MED ORDER — NIFEDIPINE 10 MG PO CAPS
10.0000 mg | ORAL_CAPSULE | ORAL | Status: DC | PRN
  Administered 2019-11-27: 10 mg via SUBLINGUAL
  Filled 2019-11-27: qty 1

## 2019-11-27 MED ORDER — BETAMETHASONE SOD PHOS & ACET 6 (3-3) MG/ML IJ SUSP
12.0000 mg | Freq: Once | INTRAMUSCULAR | Status: AC
  Administered 2019-11-27: 12 mg via INTRAMUSCULAR
  Filled 2019-11-27: qty 5

## 2019-11-27 MED ORDER — MAGNESIUM SULFATE 40 GM/1000ML IV SOLN: 2.0000 g/h | INTRAVENOUS | Status: DC

## 2019-11-27 NOTE — H&P (Signed)
OB History & Physical   History of Present Illness:  Chief Complaint: leaking fluid/contractions  HPI:  Carmen Keller is a 29 y.o. G1P0 female at [redacted]w[redacted]d dated by 9 week ultrasound.  Her pregnancy has been complicated by genital herpes- no outbreaks in past 3 years.    She reports contractions that she first noticed this evening.   She reports leakage of fluid after intercourse earlier this evening.   She denies vaginal bleeding.   She reports fetal movement.    Total weight gain for pregnancy: 18.6 kg   Obstetrical Problem List: pregnancy1 Problems (from 03/06/19 to present)    Problem Noted Resolved   Encounter for supervision of low-risk pregnancy, antepartum 06/22/2019 by Nadara Mustard, MD No   Overview Addendum 11/07/2019 11:22 AM by Mirna Mires, CNM    Clinic [facility] Prenatal Labs  Dating 9 week [place] Blood type: B/Positive/-- (02/12 1537)   Genetic Screen NIPS: Normal XX, Inheritest: CF neg, Fragile-X neg, SMA neg Antibody:Negative (02/12 1537)  Anatomic [place] Complete Rubella: <0.90 (02/12 1537) Varicella: Immune  GTT Third trimester: 147 needs 3hr- passed RPR: Non Reactive (02/12 1537)   Rhogam N/A HBsAg: Negative (02/12 1537)   TDaP vaccine      Flu Shot: 01/2019 HIV: Non Reactive (02/12 1537)   Baby Food                        Breast        GBS:   Contraception  Pap:06/22/19 NIL  CBB  No   CS/VBAC n/a   Support Person BF [name]           Previous Version       Maternal Medical History:   Past Medical History:  Diagnosis Date   Allergy    Anxiety    Depression     Past Surgical History:  Procedure Laterality Date   KNEE SURGERY      No Known Allergies  Prior to Admission medications   Medication Sig Start Date End Date Taking? Authorizing Provider  acetaminophen (TYLENOL) 500 MG tablet Take 500 mg by mouth every 6 (six) hours as needed.    [provider]  cyclobenzaprine (FLEXERIL) 10 MG tablet Take 1 tablet (10 mg total) by  mouth 3 (three) times daily as needed for muscle spasms. 11/21/19   Mirna Mires, CNM  doxylamine, Sleep, (UNISOM) 25 MG tablet Take 25 mg by mouth at bedtime as needed.    [provider]  omeprazole (PRILOSEC) 20 MG capsule Take 1 capsule (20 mg total) by mouth daily. 10/29/19   Tresea Mall, CNM  Prenatal 27-1 MG TABS Take by mouth.    [provider]    OB History  Gravida Para Term Preterm AB Living  1            SAB TAB Ectopic Multiple Live Births               # Outcome Date GA Lbr Len/2nd Weight Sex Delivery Anes PTL Lv  1 Current             Prenatal care site: [facility]  Social History: She  reports that she has never smoked. She has never used smokeless tobacco. She reports that she does not drink alcohol and does not use drugs.  Family History: family history includes Cancer in her maternal grandfather; Diabetes in her father and paternal grandfather.    Review of Systems:  Review of Systems  Constitutional: Negative for chills and fever.  HENT: Negative for congestion, ear discharge, ear pain, hearing loss, sinus pain and sore throat.   Eyes: Negative for blurred vision and double vision.  Respiratory: Negative for cough, shortness of breath and wheezing.   Cardiovascular: Negative for chest pain, palpitations and leg swelling.  Gastrointestinal: Positive for abdominal pain. Negative for blood in stool, constipation, diarrhea, heartburn, melena, nausea and vomiting.  Genitourinary: Negative for dysuria, flank pain, frequency, hematuria and urgency.       Positive for vaginal fluid leaking  Musculoskeletal: Negative for back pain, joint pain and myalgias.  Skin: Negative for itching and rash.  Neurological: Negative for dizziness, tingling, tremors, sensory change, speech change, focal weakness, seizures, loss of consciousness, weakness and headaches.  Endo/Heme/Allergies: Negative for environmental allergies. Does not bruise/bleed easily.   Psychiatric/Behavioral: Negative for depression, hallucinations, memory loss, substance abuse and suicidal ideas. The patient is not nervous/anxious and does not have insomnia.      Physical Exam:  BP 119/75    Pulse 79    Temp 98 F (36.7 C) (Oral)    Resp 16    Ht 5\' 2"  (1.575 m)    Wt 80.3 kg    LMP 03/06/2019    BMI 32.37 kg/m   Constitutional: Well nourished, well developed female in no acute distress.  HEENT: normal Skin: Warm and dry.  Cardiovascular: Regular rate and rhythm.   Extremity: no edema  Respiratory: Clear to auscultation bilateral. Normal respiratory effort Abdomen: FHT present Back: no CVAT Neuro: DTRs 2+, Cranial nerves grossly intact Psych: Alert and Oriented x3. No memory deficits. Normal mood and affect.  MS: normal gait, normal bilateral lower extremity ROM/strength/stability.  Pelvic exam:  is not limited by body habitus EGBUS: within normal limits Vagina: within normal limits and with normal mucosa  Cervix: 1.5/50/-2  Results for COY, VANDOREN (MRN Rodman Comp) as of 11/27/2019 00:53  Ref. Range 11/26/2019 22:10  Rom Plus Unknown NEGATIVE    Fetal well being: 140 bpm, moderate variability,  Accelerations: present   Decelerations: absent Contractions: present frequency: every 4-9 minutes Overall assessment: reassuring   No results found for: SARSCOV2NAA]  Assessment:  Carmen Keller is a 29 y.o. G1P0 female at [redacted]w[redacted]d with preterm contractions.   Plan:  1. Admit to Labor & Delivery for observation 2. FFN, ROM plus 3. Betamethasone q 24h x2  4. IV fluids 5. Monitor contractions/check for cervical change 6. IV magnesium if cervix is changing/painful regular contractions  [redacted]w[redacted]d, CNM 11/27/2019 12:39 AM

## 2019-11-27 NOTE — Progress Notes (Signed)
Patient has been resting comfortably most of the night and was awakened by contractions around 5:30 AM as they were more frequent and more intense than they had been. RN checked her cervix and found she is 2.5 cm/60% effaced which is a change from last night's exam. We discussed the findings and predictive value of the FFN (positive) with the limitations that it could be a false positive given intercourse and digital exam prior to collection. She is s/p first dose of betamethasone at midnight and will receive second dose tonight at midnight. We discussed using procardia as an attempt to stop preterm labor. We discussed magnesium for neuro protection and Up To Date guidelines for use.   Per Up To Date: Use magnesium when delivery is imminent in the next 24 hours Use up to 32 weeks Use for 24 hours Do not repeat treatment  Plan made to try procardia with a goal of reaching midnight and second dose of betamethasone and also achieving [redacted] week gestational age. Neonatology consult requested.   Patient has been receiving IV fluids overnight as well as oral hydration.   Discussed plan of care with Dr Jerene Pitch Start magnesium 4g loading dose/1g per hour maintenance Procardia PRN 10mg  q 72m x4 doses Regular diet Continue IV fluids Up to bathroom Neonatology consult   30m, CNM [facility] 11/27/2019, 7:12 AM

## 2019-11-27 NOTE — Progress Notes (Signed)
L&D Progress Note 29 year old G1 P0 at 31wk 6d with EDC= 01/23/2020 presented last night with complaints of having a gush of water and increased pelvic pressure and was contracting. She was not ruptured but her cervix changed from 1.5cm to 2.5cm She was begun on magnesium sulfate for neuroprotection, Procardia 10 mgm for contractions, and she was given her first dose of BMZ last night at midnight.   S: Comfortable at this time. No nausea. Contractions have spaced out  O: BP 110/64   Pulse 80   Temp 97.8 F (36.6 C) (Oral)   Resp 18   Ht 5\' 2"  (1.575 m)   Wt 80.3 kg   LMP 03/06/2019   SpO2 96%   BMI 32.37 kg/m   Abdomen: Cephalic on Leopold's, soft, NT FHR; baseline 135 with accelerations to 150s, moderate variability Toco: occasional contraction  Cervix: 2.5/80-90%/- Results for orders placed or performed during the hospital encounter of 11/26/19 (from the past 24 hour(s))  ROM Plus ([facility] only)     Status: None   Collection Time: 11/26/19 10:10 PM  Result Value Ref Range   Rom Plus NEGATIVE   Fetal fibronectin     Status: Abnormal   Collection Time: 11/26/19 11:52 PM  Result Value Ref Range   Fetal Fibronectin POSITIVE (A) NEGATIVE  Group B strep by PCR     Status: None   Collection Time: 11/27/19  6:48 AM   Specimen: Vaginal/Rectal; Genital  Result Value Ref Range   Group B strep by PCR NEGATIVE NEGATIVE  SARS Coronavirus 2 by RT PCR (hospital order, performed in [facility] hospital lab) Nasopharyngeal Nasopharyngeal Swab     Status: None   Collection Time: 11/27/19  9:30 AM   Specimen: Nasopharyngeal Swab  Result Value Ref Range   SARS Coronavirus 2 NEGATIVE NEGATIVE   A: IUP at 31wk6 days with threatened preterm labor CAt 1 tracing  P: Continue magnesium sulfate until midnight when she turns 32 weeks Continue Procardia 10 mgm every 6 hours Second dose BMZ at midnight. Give sooner if she progresses Growth scan and AFI ordered Regular diet Recheck cervix with  increasing contractions  11/29/19, CNM

## 2019-11-28 DIAGNOSIS — Z20822 Contact with and (suspected) exposure to covid-19: Secondary | ICD-10-CM | POA: Diagnosis present

## 2019-11-28 DIAGNOSIS — Z3A32 32 weeks gestation of pregnancy: Secondary | ICD-10-CM

## 2019-11-28 DIAGNOSIS — Z3A31 31 weeks gestation of pregnancy: Secondary | ICD-10-CM | POA: Diagnosis not present

## 2019-11-28 NOTE — Progress Notes (Signed)
Pt states back pain continues at times during the night but she was able to sleep. 15-30 sec UI seen through night

## 2019-11-28 NOTE — Progress Notes (Signed)
L&D Progress NOteL&D Progress Note 29 year old G1 P0 at [redacted] weeks gestation with EDC= 01/23/2020 presented 2 days ago with complaints of having a gush of water and increased pelvic pressure and was contracting. She was not ruptured but her cervix changed from 1.5cm to 2.5cm She was begun on magnesium sulfate for neuroprotection which was discontinued at midnight last night. She is on  Procardia 10 mgm every 6 hours for contractions, and she has received 2 doses of BMZ, the most recent one was last night at midnight.   S: Feeling pressure and occasionally has some cramping. Usually the cramping is more noticable the hour before the next dose of Procardia No nausea. Good baby movement  O: BP (!) 103/46   Pulse 92   Temp 97.8 F (36.6 C)   Resp 16   Ht 5\' 2"  (1.575 m)   Wt 80.3 kg   LMP 03/06/2019   SpO2 97%   BMI 32.37 kg/m   Abdomen: soft, NT FHR; 135 baseline with accelerations to 150s to 160s, moderate variability. Possibly 3 variable decelerations to 80s with contractions around 1100 this AM.  Toco: occasional contractions  Cervix: 2.5/75-80%/-1 to 0  Growth ultrasound yesterday with baby in the 85th% (2172gm) 11-18-1980 OB Comp + 14 Wk  Result Date: 11/27/2019 CLINICAL DATA:  Pre term labor. EXAM: OBSTETRICAL ULTRASOUND >14 WKS FINDINGS: Number of Fetuses: 1 Heart Rate:  141 bpm Movement: Present Presentation: Cephalic Previa: No Placental Location: Posterior fundal Amniotic Fluid (Subjective): Decreased Amniotic Fluid (Objective): AFI = 9.3 cm (5%ile= 8.6 cm, 95%= 24.2 cm for 32 wks) FETAL BIOMETRY BPD: 8.6cm 34w 3d HC:   30.8cm 34w 2d AC:   29.9cm 33w 6d FL:   6.1cm 31w 4d Current Mean GA: 33w 3d 11/29/2019 EDC: 01/12/2020 Assigned GA:  31w 6d Assigned EDC: 01/23/2020 Estimated Fetal Weight:  2,172g 85%ile FETAL ANATOMY Lateral Ventricles: Appears normal Thalami/CSP: Appears normal Posterior Fossa:  Not visualized Nuchal Region: Not visualized Upper Lip: Appears normal Spine: Appears normal 4 Chamber  Heart on Left: Appears normal LVOT: Appears normal RVOT: Appears normal Stomach on Left: Appears normal 3 Vessel Cord: Appears normal Cord Insertion site: Not visualized Kidneys: Appears normal Bladder: Appears normal Extremities: Appears normal Technically difficult due to: Fetal position Maternal Findings: Cervix:  2.2 cm and closed. IMPRESSION: 1. Single viable intrauterine pregnancy at 33 weeks 3 days. Cephalic presentation. Amniotic fluid volume appears decreased subjectively. AFI 9.3 cm. 2.  Maternal cervix 2.2 cm and closed. 3. Fetal posterior fossa, nuchal region, and cord insertion site not visualized due to fetal position. Follow-up exam can be obtained. Electronically Signed   By: 01/25/2020  Register   On: 11/27/2019 12:55     A: IUP at 32 weeks with threatened preterm labor Overall reassuring FH tracing No further cervical change since yesterday AM.   P: Transfer to [facility] unit  s/p magnesium for neuroprotection Continue Procardia 10 mgm every 6 hours S/p BMZ x2 Regular diet Recheck cervix with increasing contractions  [facility], CNM

## 2019-11-29 MED ORDER — NIFEDIPINE 10 MG PO CAPS: 10.0000 mg | ORAL_CAPSULE | Freq: Four times a day (QID) | ORAL | 2 refills | Status: DC

## 2019-11-29 NOTE — Discharge Instructions (Signed)
Third Trimester of Pregnancy The third trimester is from week 28 through week 40 (months 7 through 9). The third trimester is a time when the unborn baby (fetus) is growing rapidly. At the end of the ninth month, the fetus is about 20 inches in length and weighs 6-10 pounds. Body changes during your third trimester Your body will continue to go through many changes during pregnancy. The changes vary from woman to woman. During the third trimester:  Your weight will continue to increase. You can expect to gain 25-35 pounds (11-16 kg) by the end of the pregnancy.  You may begin to get stretch marks on your hips, abdomen, and breasts.  You may urinate more often because the fetus is moving lower into your pelvis and pressing on your bladder.  You may develop or continue to have heartburn. This is caused by increased hormones that slow down muscles in the digestive tract.  You may develop or continue to have constipation because increased hormones slow digestion and cause the muscles that push waste through your intestines to relax.  You may develop hemorrhoids. These are swollen veins (varicose veins) in the rectum that can itch or be painful.  You may develop swollen, bulging veins (varicose veins) in your legs.  You may have increased body aches in the pelvis, back, or thighs. This is due to weight gain and increased hormones that are relaxing your joints.  You may have changes in your hair. These can include thickening of your hair, rapid growth, and changes in texture. Some women also have hair loss during or after pregnancy, or hair that feels dry or thin. Your hair will most likely return to normal after your baby is born.  Your breasts will continue to grow and they will continue to become tender. A yellow fluid (colostrum) may leak from your breasts. This is the first milk you are producing for your baby.  Your belly button may stick out.  You may notice more swelling in your hands,  face, or ankles.  You may have increased tingling or numbness in your hands, arms, and legs. The skin on your belly may also feel numb.  You may feel short of breath because of your expanding uterus.  You may have more problems sleeping. This can be caused by the size of your belly, increased need to urinate, and an increase in your body's metabolism.  You may notice the fetus "dropping," or moving lower in your abdomen (lightening).  You may have increased vaginal discharge.  You may notice your joints feel loose and you may have pain around your pelvic bone. What to expect at prenatal visits You will have prenatal exams every 2 weeks until week 36. Then you will have weekly prenatal exams. During a routine prenatal visit:  You will be weighed to make sure you and the baby are growing normally.  Your blood pressure will be taken.  Your abdomen will be measured to track your baby's growth.  The fetal heartbeat will be listened to.  Any test results from the previous visit will be discussed.  You may have a cervical check near your due date to see if your cervix has softened or thinned (effaced).  You will be tested for Group B streptococcus. This happens between 35 and 37 weeks. Your health care provider may ask you:  What your birth plan is.  How you are feeling.  If you are feeling the baby move.  If you have had any abnormal   symptoms, such as leaking fluid, bleeding, severe headaches, or abdominal cramping.  If you are using any tobacco products, including cigarettes, chewing tobacco, and electronic cigarettes.  If you have any questions. Other tests or screenings that may be performed during your third trimester include:  Blood tests that check for low iron levels (anemia).  Fetal testing to check the health, activity level, and growth of the fetus. Testing is done if you have certain medical conditions or if there are problems during the pregnancy.  Nonstress test  (NST). This test checks the health of your baby to make sure there are no signs of problems, such as the baby not getting enough oxygen. During this test, a belt is placed around your belly. The baby is made to move, and its heart rate is monitored during movement. What is false labor? False labor is a condition in which you feel small, irregular tightenings of the muscles in the womb (contractions) that usually go away with rest, changing position, or drinking water. These are called Braxton Hicks contractions. Contractions may last for hours, days, or even weeks before true labor sets in. If contractions come at regular intervals, become more frequent, increase in intensity, or become painful, you should see your health care provider. What are the signs of labor?  Abdominal cramps.  Regular contractions that start at 10 minutes apart and become stronger and more frequent with time.  Contractions that start on the top of the uterus and spread down to the lower abdomen and back.  Increased pelvic pressure and dull back pain.  A watery or bloody mucus discharge that comes from the vagina.  Leaking of amniotic fluid. This is also known as your "water breaking." It could be a slow trickle or a gush. Let your health care provider know if it has a color or strange odor. If you have any of these signs, call your health care provider right away, even if it is before your due date. Follow these instructions at home: Medicines  Follow your health care provider's instructions regarding medicine use. Specific medicines may be either safe or unsafe to take during pregnancy.  Take a prenatal vitamin that contains at least 600 micrograms (mcg) of folic acid.  If you develop constipation, try taking a stool softener if your health care provider approves. Eating and drinking   Eat a balanced diet that includes fresh fruits and vegetables, whole grains, good sources of protein such as meat, eggs, or tofu,  and low-fat dairy. Your health care provider will help you determine the amount of weight gain that is right for you.  Avoid raw meat and uncooked cheese. These carry germs that can cause birth defects in the baby.  If you have low calcium intake from food, talk to your health care provider about whether you should take a daily calcium supplement.  Eat four or five small meals rather than three large meals a day.  Limit foods that are high in fat and processed sugars, such as fried and sweet foods.  To prevent constipation: ? Drink enough fluid to keep your urine clear or pale yellow. ? Eat foods that are high in fiber, such as fresh fruits and vegetables, whole grains, and beans. Activity  Exercise only as directed by your health care provider. Most women can continue their usual exercise routine during pregnancy. Try to exercise for 30 minutes at least 5 days a week. Stop exercising if you experience uterine contractions.  Avoid heavy lifting.  Do   not exercise in extreme heat or humidity, or at high altitudes.  Wear low-heel, comfortable shoes.  Practice good posture.  You may continue to have sex unless your health care provider tells you otherwise. Relieving pain and discomfort  Take frequent breaks and rest with your legs elevated if you have leg cramps or low back pain.  Take warm sitz baths to soothe any pain or discomfort caused by hemorrhoids. Use hemorrhoid cream if your health care provider approves.  Wear a good support bra to prevent discomfort from breast tenderness.  If you develop varicose veins: ? Wear support pantyhose or compression stockings as told by your healthcare provider. ? Elevate your feet for 15 minutes, 3-4 times a day. Prenatal care  Write down your questions. Take them to your prenatal visits.  Keep all your prenatal visits as told by your health care provider. This is important. Safety  Wear your seat belt at all times when driving.  Make  a list of emergency phone numbers, including numbers for family, friends, the hospital, and police and fire departments. General instructions  Avoid cat litter boxes and soil used by cats. These carry germs that can cause birth defects in the baby. If you have a cat, ask someone to clean the litter box for you.  Do not travel far distances unless it is absolutely necessary and only with the approval of your health care provider.  Do not use hot tubs, steam rooms, or saunas.  Do not drink alcohol.  Do not use any products that contain nicotine or tobacco, such as cigarettes and e-cigarettes. If you need help quitting, ask your health care provider.  Do not use any medicinal herbs or unprescribed drugs. These chemicals affect the formation and growth of the baby.  Do not douche or use tampons or scented sanitary pads.  Do not cross your legs for long periods of time.  To prepare for the arrival of your baby: ? Take prenatal classes to understand, practice, and ask questions about labor and delivery. ? Make a trial run to the hospital. ? Visit the hospital and tour the maternity area. ? Arrange for maternity or paternity leave through employers. ? Arrange for family and friends to take care of pets while you are in the hospital. ? Purchase a rear-facing car seat and make sure you know how to install it in your car. ? Pack your hospital bag. ? Prepare the baby's nursery. Make sure to remove all pillows and stuffed animals from the baby's crib to prevent suffocation.  Visit your dentist if you have not gone during your pregnancy. Use a soft toothbrush to brush your teeth and be gentle when you floss. Contact a health care provider if:  You are unsure if you are in labor or if your water has broken.  You become dizzy.  You have mild pelvic cramps, pelvic pressure, or nagging pain in your abdominal area.  You have lower back pain.  You have persistent nausea, vomiting, or  diarrhea.  You have an unusual or bad smelling vaginal discharge.  You have pain when you urinate. Get help right away if:  Your water breaks before 37 weeks.  You have regular contractions less than 5 minutes apart before 37 weeks.  You have a fever.  You are leaking fluid from your vagina.  You have spotting or bleeding from your vagina.  You have severe abdominal pain or cramping.  You have rapid weight loss or weight gain.  You have   shortness of breath with chest pain.  You notice sudden or extreme swelling of your face, hands, ankles, feet, or legs.  Your baby makes fewer than 10 movements in 2 hours.  You have severe headaches that do not go away when you take medicine.  You have vision changes. Summary  The third trimester is from week 28 through week 40, months 7 through 9. The third trimester is a time when the unborn baby (fetus) is growing rapidly.  During the third trimester, your discomfort may increase as you and your baby continue to gain weight. You may have abdominal, leg, and back pain, sleeping problems, and an increased need to urinate.  During the third trimester your breasts will keep growing and they will continue to become tender. A yellow fluid (colostrum) may leak from your breasts. This is the first milk you are producing for your baby.  False labor is a condition in which you feel small, irregular tightenings of the muscles in the womb (contractions) that eventually go away. These are called Braxton Hicks contractions. Contractions may last for hours, days, or even weeks before true labor sets in.  Signs of labor can include: abdominal cramps; regular contractions that start at 10 minutes apart and become stronger and more frequent with time; watery or bloody mucus discharge that comes from the vagina; increased pelvic pressure and dull back pain; and leaking of amniotic fluid. This information is not intended to replace advice given to you by your  health care provider. Make sure you discuss any questions you have with your health care provider. Document Revised: 08/17/2018 Document Reviewed: 06/01/2016 Elsevier Patient Education  2020 [employer].    Preterm Labor and Birth Information  The normal length of a pregnancy is 39-41 weeks. Preterm labor is when labor starts before 37 completed weeks of pregnancy. What are the risk factors for preterm labor? Preterm labor is more likely to occur in women who:  Have certain infections during pregnancy such as a bladder infection, sexually transmitted infection, or infection inside the uterus (chorioamnionitis).  Have a shorter-than-normal cervix.  Have gone into preterm labor before.  Have had surgery on their cervix.  Are younger than age 35 or older than age 24.  Are African American.  Are pregnant with twins or multiple babies (multiple gestation).  Take street drugs or smoke while pregnant.  Do not gain enough weight while pregnant.  Became pregnant shortly after having been pregnant. What are the symptoms of preterm labor? Symptoms of preterm labor include:  Cramps similar to those that can happen during a menstrual period. The cramps may happen with diarrhea.  Pain in the abdomen or lower back.  Regular uterine contractions that may feel like tightening of the abdomen.  A feeling of increased pressure in the pelvis.  Increased watery or bloody mucus discharge from the vagina.  Water breaking (ruptured amniotic sac). Why is it important to recognize signs of preterm labor? It is important to recognize signs of preterm labor because babies who are born prematurely may not be fully developed. This can put them at an increased risk for:  Long-term (chronic) heart and lung problems.  Difficulty immediately after birth with regulating body systems, including blood sugar, body temperature, heart rate, and breathing rate.  Bleeding in the brain.  Cerebral  palsy.  Learning difficulties.  Death. These risks are highest for babies who are born before 34 weeks of pregnancy. How is preterm labor treated? Treatment depends on the length of  your pregnancy, your condition, and the health of your baby. It may involve:  Having a stitch (suture) placed in your cervix to prevent your cervix from opening too early (cerclage).  Taking or being given medicines, such as: ? Hormone medicines. These may be given early in pregnancy to help support the pregnancy. ? Medicine to stop contractions. ? Medicines to help mature the baby's lungs. These may be prescribed if the risk of delivery is high. ? Medicines to prevent your baby from developing cerebral palsy. If the labor happens before 34 weeks of pregnancy, you may need to stay in the hospital. What should I do if I think I am in preterm labor? If you think that you are going into preterm labor, call your health care provider right away. How can I prevent preterm labor in future pregnancies? To increase your chance of having a full-term pregnancy:  Do not use any tobacco products, such as cigarettes, chewing tobacco, and e-cigarettes. If you need help quitting, ask your health care provider.  Do not use street drugs or medicines that have not been prescribed to you during your pregnancy.  Talk with your health care provider before taking any herbal supplements, even if you have been taking them regularly.  Make sure you gain a healthy amount of weight during your pregnancy.  Watch for infection. If you think that you might have an infection, get it checked right away.  Make sure to tell your health care provider if you have gone into preterm labor before. This information is not intended to replace advice given to you by your health care provider. Make sure you discuss any questions you have with your health care provider. Document Revised: 08/18/2018 Document Reviewed: 09/17/2015 Elsevier Patient  Education  2020 [employer].  Preventing Preterm Birth Preterm birth is when your baby is delivered between 20 weeks and 37 weeks of pregnancy. A full-term pregnancy lasts for at least 37 weeks. Preterm birth can be dangerous for your baby because the last few weeks of pregnancy are an important time for your baby's brain and lungs to grow. Many things can cause a baby to be born early. Sometimes the cause is not known. There are certain factors that make you more likely to experience preterm birth, such as:  Having a previous baby born preterm.  Being pregnant with twins or other multiples.  Having had fertility treatment.  Being overweight or underweight at the start of your pregnancy.  Having any of the following during pregnancy: ? An infection, including a urinary tract infection (UTI) or an STI (sexually transmitted infection). ? High blood pressure. ? Diabetes. ? Vaginal bleeding.  Being age 70 or older.  Being age 84 or younger.  Getting pregnant within 6 months of a previous pregnancy.  Suffering extreme stress or physical or emotional abuse during pregnancy.  Standing for long periods of time during pregnancy, such as working at a job that requires standing. What are the risks? The most serious risk of preterm birth is that the baby may not survive. This is more likely to happen if a baby is born before 34 weeks. Other risks and complications of preterm birth may include your baby having:  Breathing problems.  Brain damage that affects movement and coordination (cerebral palsy).  Feeding difficulties.  Vision or hearing problems.  Infections or inflammation of the digestive tract (colitis).  Developmental delays.  Learning disabilities.  Higher risk for diabetes, heart disease, and high blood pressure later in life.  What can I do to lower my risk?  Medical care The most important thing you can do to lower your risk for preterm birth is to get routine  medical care during pregnancy (prenatal care). If you have a high risk of preterm birth, you may be referred to a health care provider who specializes in managing high-risk pregnancies (perinatologist). You may be given medicine to help prevent preterm birth. Lifestyle changes Certain lifestyle changes can also lower your risk of preterm birth:  Wait at least 6 months after a pregnancy to become pregnant again.  Try to plan pregnancy for when you are between 17 and 10 years old.  Get to a healthy weight before getting pregnant. If you are overweight, work with your health care provider to safely lose weight.  Do not use any products that contain nicotine or tobacco, such as cigarettes and e-cigarettes. If you need help quitting, ask your health care provider.  Do not drink alcohol.  Do not use drugs. Where to find support For more support, consider:  Talking with your health care provider.  Talking with a therapist or substance abuse counselor, if you need help quitting.  Working with a diet and nutrition specialist (dietitian) or a Systems analyst to maintain a healthy weight.  Joining a support group. Where to find more information Learn more about preventing preterm birth from:  Centers for Disease Control and Prevention: http://curry.org/  March of Dimes: marchofdimes.org/complications/premature-babies.aspx  American Pregnancy Association: americanpregnancy.org/labor-and-birth/premature-labor Contact a health care provider if:  You have any of the following signs of preterm labor before 37 weeks: ? A change or increase in vaginal discharge. ? Fluid leaking from your vagina. ? Pressure or cramps in your lower abdomen. ? A backache that does not go away or gets worse. ? Regular tightening (contractions) in your lower abdomen. Summary  Preterm birth means having your baby during weeks 20-37 of pregnancy.  Preterm birth may  put your baby at risk for physical and mental problems.  Getting good prenatal care can help prevent preterm birth.  You can lower your risk of preterm birth by making certain lifestyle changes, such as not smoking and not using alcohol. This information is not intended to replace advice given to you by your health care provider. Make sure you discuss any questions you have with your health care provider. Document Revised: 04/08/2017 Document Reviewed: 01/03/2016 Elsevier Patient Education  2020 [employer].

## 2019-11-29 NOTE — Progress Notes (Signed)
Patient ID: Katora Fini, female   DOB: [birth year], 29 y.o.   MRN: [id number] Called to [facility] where patient is having an NST. Carrine is a 29 yo G1 P0 , admitted for preterm labor on 11/26/2019  To the Labor and Delivery unit. Earlier POC was for her to be discharged today. She has received two doses of BMZ, and also received magnesium sulphate for fetal neuroprotection. She was moved to the Mother Baby unit yesterday, and has been resting. She reports having irregular, mild contractions this morning (these are not painful to her). The baby is moving well. The EFM tracing shows a baseline of 145, minimal to moderate variablity, with no accels, and no decels. In light of the contraction activity, a cervical exam is performed. The cervix is anterior, very effaced (80%), and measures 2.5-tight 3cms. There is a small forbag felt, and the vertex is at -1 station. The EFM tracing is discussed with the patient and with Dr. Jerene Pitch. We will keep the patient in [facility] for ongoing monitoring for now. She is hydrating po and will also eat a snack.  Her discharge is thus on hold .  Mirna Mires, CNM  11/29/2019 11:48 AM

## 2019-11-29 NOTE — Progress Notes (Signed)
Dc to home.  To car via WC

## 2019-11-29 NOTE — Progress Notes (Signed)
Pt given discharge instructions including medications and follow up appt. Pt had no questions. Pt stable at discharge.

## 2019-11-29 NOTE — Progress Notes (Signed)
Eunice Blase, CNM notified of patient's non-reactive NST at 11:08. Discussed and reviewed yesterday's fetal monitoring results and SVE. SVE performed at 11:18: 2.5-3/80%/-1. Plan is to continue to monitor patient for an extended time. Patient repositioned to L lateral and external FHR and Toco readjusted.

## 2019-11-29 NOTE — Discharge Summary (Signed)
Physician Discharge Summary  Patient ID: Carmen Keller MRN: [id number] DOB/AGE: [birth year] 29 y.o.  Admit date: 11/26/2019 Discharge date: 11/29/2019  Admission Diagnoses:  Discharge Diagnoses:  Active Problems:   Labor and delivery, indication for care   [redacted] weeks gestation of pregnancy   Threatened premature labor in third trimester   Preterm labor   Preterm labor in third trimester   Discharged Condition: good  Hospital Course: Lenix presented to the hospital with complaints of possible rupture of membranes.  Her exam and ROM plus were negative for rupture of membranes.  However the patient was having regular uterine contractions, made cervical change to 2.5 cm, and had a positive fetal fibronectin.  For this reason the patient was given betamethasone.  She was also started on magnesium which she continued for 24 hours.  She was started on nifedipine for tocolysis.  Patient has been stable and no additional cervical change has been observed.  She is feeling normal fetal movement.  She has no complaints of fever or diarrhea or vaginal bleeding.  She is feeling some uterine irritability but no painful contractions.  She will be discharged home.  Plan is for her to be taken out of work however we discussed that strict bedrest is not needed.  Pelvic rest and refraining from intercourse was encouraged.  Discussed signs of preterm labor and reasons to return to the hospital for further evaluation.  Patient has plans for close follow-up in office on Monday NST prior to her discharge was reactive.  Consults: Neonatology  Significant Diagnostic Studies: labs: Positive FFN, see Epic.  Treatments: IV hydration, nifedipine, betamethasone, IV magnesium  Discharge Exam: Blood pressure 109/68, pulse 68, temperature 98.5 F (36.9 C), temperature source Oral, resp. rate 18, height 5\' 2"  (1.575 m), weight 80.3 kg, last menstrual period 03/06/2019, SpO2 100 %. General appearance: alert,  cooperative and appears stated age Chest wall: no tenderness Cardio: regular rate and rhythm, S1, S2 normal, no murmur, click, rub or gallop GI: soft, non-tender; bowel sounds normal; no masses,  no organomegaly Extremities: extremities normal, atraumatic, no cyanosis or edema Skin: Skin color, texture, turgor normal. No rashes or lesions  Disposition: Discharge disposition: 01-Home or Self Care       Discharge Instructions    Activity as tolerated   Complete by: As directed    Walk daily   Call MD for:  difficulty breathing, headache or visual disturbances   Complete by: As directed    Call MD for:  extreme fatigue   Complete by: As directed    Call MD for:  hives   Complete by: As directed    Call MD for:  persistant dizziness or light-headedness   Complete by: As directed    Call MD for:  persistant nausea and vomiting   Complete by: As directed    Call MD for:  redness, tenderness, or signs of infection (pain, swelling, redness, odor or green/yellow discharge around incision site)   Complete by: As directed    Call MD for:  severe uncontrolled pain   Complete by: As directed    Call MD for:  temperature >100.4   Complete by: As directed    Diet general   Complete by: As directed    Lifting restrictions   Complete by: As directed    Weight restriction of 20 lbs.   Other restrictions (specify):   Complete by: As directed    Pelvic rest, no intercourse     Allergies as of 11/29/2019  No Known Allergies     Medication List    TAKE these medications   acetaminophen 500 MG tablet Commonly known as: TYLENOL Take 500 mg by mouth every 6 (six) hours as needed.   cyclobenzaprine 10 MG tablet Commonly known as: FLEXERIL Take 1 tablet (10 mg total) by mouth 3 (three) times daily as needed for muscle spasms.   doxylamine (Sleep) 25 MG tablet Commonly known as: UNISOM Take 25 mg by mouth at bedtime as needed.   NIFEdipine 10 MG capsule Commonly known as:  PROCARDIA Take 1 capsule (10 mg total) by mouth every 6 (six) hours.   omeprazole 20 MG capsule Commonly known as: PRILOSEC Take 1 capsule (20 mg total) by mouth daily.   Prenatal 27-1 MG Tabs Take by mouth.        Signed: Natale Milch 11/29/2019, 1:18 PM

## 2019-11-30 ENCOUNTER — Other Ambulatory Visit: Payer: Self-pay

## 2019-11-30 DIAGNOSIS — O26892 Other specified pregnancy related conditions, second trimester: Secondary | ICD-10-CM

## 2019-11-30 MED ORDER — NIFEDIPINE 10 MG PO CAPS: 10.0000 mg | ORAL_CAPSULE | Freq: Four times a day (QID) | ORAL | 2 refills | Status: DC

## 2019-11-30 NOTE — Telephone Encounter (Signed)
Pt needs the procardia for her contractions, she hasnt had it in 24 hours and the contractions are picking back up, shes having a had time finding a pharmacy that can fill the rx. She states they all cary 30 mg. [facility] [address] [facility] [place] [facility] could fill it, I sent the rx to [facility]

## 2019-12-03 ENCOUNTER — Ambulatory Visit (INDEPENDENT_AMBULATORY_CARE_PROVIDER_SITE_OTHER): Payer: BC Managed Care – PPO | Admitting: Obstetrics

## 2019-12-03 ENCOUNTER — Other Ambulatory Visit: Payer: Self-pay

## 2019-12-03 DIAGNOSIS — Z3A32 32 weeks gestation of pregnancy: Secondary | ICD-10-CM

## 2019-12-03 DIAGNOSIS — Z3490 Encounter for supervision of normal pregnancy, unspecified, unspecified trimester: Secondary | ICD-10-CM

## 2019-12-03 LAB — POCT URINALYSIS DIPSTICK OB
Glucose, UA: NEGATIVE
POC,PROTEIN,UA: NEGATIVE

## 2019-12-03 NOTE — Progress Notes (Signed)
Routine Prenatal Care Visit  Subjective  Carmen Keller is a 29 y.o. G1P0 at [redacted]w[redacted]d being seen today for ongoing prenatal care.  She is currently monitored for the following issues for this high-risk pregnancy and has Encounter for supervision of low-risk pregnancy, antepartum; Herpes simplex; Labor and delivery, indication for care; [redacted] weeks gestation of pregnancy; Threatened premature labor in third trimester; Preterm labor; and Preterm labor in third trimester on their problem list.  ----------------------------------------------------------------------------------- Patient reports backache, no bleeding, no leaking and occasional contractions.  She is taking her procardia as prescribed. Still contracts. Drinking more water, but not 8 glasses per day. Baby moves well. Contractions: Not present. Vag. Bleeding: None.  Movement: Present. Leaking Fluid denies.  ----------------------------------------------------------------------------------- The following portions of the patient's history were reviewed and updated as appropriate: allergies, current medications, past family history, past medical history, past social history, past surgical history and problem list. Problem list updated.  Objective  Blood pressure 100/68, height 5\' 2"  (1.575 m), weight 178 lb (80.7 kg), last menstrual period 03/06/2019. Pregravid weight 137 lb (62.1 kg) Total Weight Gain 41 lb (18.6 kg) Urinalysis: Urine Protein Negative  Urine Glucose Negative  Fetal Status:     Movement: Present     General:  Alert, oriented and cooperative. Patient is in no acute distress.  Skin: Skin is warm and dry. No rash noted.   Cardiovascular: Normal heart rate noted  Respiratory: Normal respiratory effort, no problems with respiration noted  Abdomen: Soft, gravid, appropriate for gestational age. Pain/Pressure: Absent     Pelvic:  Cervical exam deferred        Extremities: Normal range of motion.     Mental Status: Normal  mood and affect. Normal behavior. Normal judgment and thought content.   Assessment   29 y.o. G1P0 at [redacted]w[redacted]d by  01/23/2020, by Ultrasound presenting for routine prenatal visit At risk for preterm delivery. ( seen and monitored for cervical change and preterm contractions.  Plan   pregnancy1 Problems (from 03/06/19 to present)    Problem Noted Resolved   Encounter for supervision of low-risk pregnancy, antepartum 06/22/2019 by 08/20/2019, MD No   Overview Addendum 11/07/2019 11:22 AM by 11/09/2019, CNM    [facility] Prenatal Labs  Dating 9 week Mirna Mires Blood type: B/Positive/-- (02/12 1537)   Genetic Screen NIPS: Normal XX, Inheritest: CF neg, Fragile-X neg, SMA neg Antibody:Negative (02/12 1537)  Anatomic 03[birth year]1994 Complete Rubella: <0.90 (02/12 1537) Varicella: Immune  GTT Third trimester: 147 needs 3hr- passed RPR: Non Reactive (02/12 1537)   Rhogam N/A HBsAg: Negative (02/12 1537)   TDaP vaccine      Flu Shot: 01/2019 HIV: Non Reactive (02/12 1537)   Baby Food                        Breast        GBS:   Contraception  Pap:06/22/19 NIL  CBB  No   CS/VBAC n/a   Support Person BF [name]           Previous Version       Preterm labor symptoms and general obstetric precautions including but not limited to vaginal bleeding, contractions, leaking of fluid and fetal movement were reviewed in detail with the patient. Please refer to After Visit Summary for other counseling recommendations.  A note is provided for her today, as she is at high risk for preterm delivery.  No follow-ups on file.  08/20/19,  CNM  12/03/2019 9:32 AM

## 2019-12-07 ENCOUNTER — Encounter: Payer: BC Managed Care – PPO | Admitting: Obstetrics

## 2019-12-11 ENCOUNTER — Other Ambulatory Visit: Payer: Self-pay

## 2019-12-11 ENCOUNTER — Ambulatory Visit: Payer: BC Managed Care – PPO | Admitting: Certified Nurse Midwife

## 2019-12-11 VITALS — BP 118/78 | Wt 182.0 lb

## 2019-12-11 LAB — POCT URINALYSIS DIPSTICK OB
Glucose, UA: NEGATIVE
POC,PROTEIN,UA: NEGATIVE

## 2019-12-11 NOTE — Progress Notes (Unsigned)
No concerns.rj  Pt rescheduled before being seen.rj

## 2019-12-12 ENCOUNTER — Observation Stay
Admission: EM | Admit: 2019-12-12 | Discharge: 2019-12-12 | Disposition: A | Discharge status: Home / Self Care | Payer: BC Managed Care – PPO | Attending: Certified Nurse Midwife | Admitting: Certified Nurse Midwife

## 2019-12-12 ENCOUNTER — Encounter: Payer: Self-pay | Admitting: Obstetrics & Gynecology

## 2019-12-12 DIAGNOSIS — O4700 False labor before 37 completed weeks of gestation, unspecified trimester: Secondary | ICD-10-CM

## 2019-12-12 DIAGNOSIS — Z3A34 34 weeks gestation of pregnancy: Secondary | ICD-10-CM | POA: Diagnosis not present

## 2019-12-12 DIAGNOSIS — O4703 False labor before 37 completed weeks of gestation, third trimester: Secondary | ICD-10-CM

## 2019-12-12 DIAGNOSIS — Z3490 Encounter for supervision of normal pregnancy, unspecified, unspecified trimester: Secondary | ICD-10-CM

## 2019-12-12 MED ORDER — NIFEDIPINE 10 MG PO CAPS
10.0000 mg | ORAL_CAPSULE | Freq: Once | ORAL | Status: AC
  Administered 2019-12-12: 10 mg via ORAL
  Filled 2019-12-12: qty 1

## 2019-12-12 NOTE — OB Triage Note (Signed)
Pt presents to unit c/o tightening in her abdomen that began around 1400 this afternoon. "The tightening comes and goes but there is intense continuous pressure." Pt also reports having "clear, warm fluid coming about 4 times since 1745". Upon RN assessment, no leakage of fluid noted. Pt reports +FM, denies vaginal bleeding.

## 2019-12-12 NOTE — Final Progress Note (Signed)
Physician Final Progress Note  Patient ID: Carmen Keller MRN: [id number] DOB/AGE: [birth year] 29 y.o.  Admit date: 12/12/2019 Admitting provider: Nadara Mustard, MD/ Gasper Lloyd. Sharen Hones, CNM Discharge date: 12/12/2019   Admission Diagnoses: IUP at 34 weeks with pelvic pressure and contractions  Discharge Diagnoses:  Same as above.  Consults: None  Significant Findings/ Diagnostic Studies:   HPI:  Carmen Keller is a 29 y.o. G1P0 female with EDC=01/23/2020 at [redacted]wk gestation dated by a 9wk2d ultrasound.  Her pregnancy has been complicated by threatened preterm labor at [facility]. the patient was having regular uterine contractions, made cervical change to 2.5 cm, and had a positive fetal fibronectin.  For this reason the patient was given betamethasone.  She was also started on magnesium which she continued for 24 hours.  She was started on nifedipine for tocolysis and is continuing to take the procardia 10 mgm every 6 hours. Her next dose of Procardia is due now.  She was hospitalized from 11/26/19 to 11/29/2019 and was discharged after her cervix made no further change and her contractions essentially stopped on the Procardia.    She presents to L&D for evaluation of tightening in her abdomen that began around 1400 this afternoon. "The tightening comes and goes but there is intense continuous pressure." Pt also reports having "clear, warm fluid coming about 4 times since 1745". Baby is active.    Prenatal care site: Prenatal care at [facility] has been remarkable for   [facility] Prenatal Labs  Dating 9 week [place] Blood type: B/Positive/-- (02/12 1537)   Genetic Screen NIPS: Normal XX, Inheritest: CF neg, Fragile-X neg, SMA neg Antibody:Negative (02/12 1537)  Anatomic [place] Complete Rubella: <0.90 (02/12 1537) Varicella: Immune  GTT Third trimester: 147 needs 3hr- passed RPR: Non Reactive (02/12 1537)   Rhogam N/A HBsAg: Negative (02/12 1537)   TDaP vaccine     11/07/2019  Flu Shot: 01/2019 HIV: Non Reactive (02/12 1537)   Baby Food                        Breast        GBS: negative 11/27/19  Contraception  Pap:06/22/19 NIL  CBB  No   CS/VBAC n/a   Support Person BF [name]         Maternal Medical History:   Past Medical History:  Diagnosis Date  . Allergy   . Anxiety   . Depression     Past Surgical History:  Procedure Laterality Date  . KNEE SURGERY      No Known Allergies  Prior to Admission medications   Medication Sig Start Date End Date Taking? Authorizing Provider  cyclobenzaprine (FLEXERIL) 10 MG tablet Take 1 tablet (10 mg total) by mouth 3 (three) times daily as needed for muscle spasms. 11/21/19  Yes Mirna Mires, CNM  NIFEdipine (PROCARDIA) 10 MG capsule Take 1 capsule (10 mg total) by mouth every 6 (six) hours. 11/30/19  Yes Schuman, Christanna R, MD  omeprazole (PRILOSEC) 20 MG capsule Take 1 capsule (20 mg total) by mouth daily. 10/29/19  Yes Tresea Mall, CNM  Prenatal 27-1 MG TABS Take by mouth.   Yes [provider]  acetaminophen (TYLENOL) 500 MG tablet Take 500 mg by mouth every 6 (six) hours as needed.    [provider]  doxylamine, Sleep, (UNISOM) 25 MG tablet Take 25 mg by mouth at bedtime as needed. Patient not taking: Reported on 12/11/2019  [provider]      Social History: She  reports that she has never smoked. She has never used smokeless tobacco. She reports that she does not drink alcohol and does not use drugs.  Family History: family history includes Cancer in her maternal grandfather; Diabetes in her father and paternal grandfather.   Review of Systems: Negative x 10 systems reviewed except as noted in the HPI.      Physical Exam:  Vital Signs: BP 132/81 (BP Location: Right Arm)   Pulse 91   Temp 98.1 F (36.7 C) (Oral)   Resp 18   Ht 5\' 2"  (1.575 m)   Wt 82.6 kg   LMP 03/06/2019   BMI 33.29 kg/m  General: gravid WF in no acute distress.  HEENT:  normocephalic, atraumatic Heart: regular rate  Lungs: normal respiratory effort Abdomen: soft, gravid, non-tender;   Pelvic:   External: Normal external female genitalia  Vagina: mucoid discharge at cervix  Cervix: Dilation: 2.5 / Effacement (%): 80 / Station: -1, 0   Wet [place]: - clue cells; - trichomonas;  - yeast  ROM: - pooling - ferning Extremities: non-tender, symmetric, trace edema bilaterally.   Neurologic: Alert & oriented x 3.    Baseline FHR: baseline 135 with accelerations to 160s to 170s, moderate variability Toco: contractions every 2-5 minutes apart, mild to palpation   Bedside Ultrasound: cephalic presentation/ subjectively normal AFI.  Assessment:  Carmen Keller is a 29 y.o. G1P0 female at [redacted]wk gestation with preterm contractions-no cervical change since her hospitalization for preterm labor. No evidence of SROM.  FWB: CAt 1 tracing  PLAN: Patient would like to discharged home since there has been no cervical change. She received a dose of Procardia 10 mgm prior to discharge Labor precautions [facility] up at [facility] as scheduled.   [facility], CNM   Procedures: none  Discharge Condition: stable  Disposition: Discharge disposition: 01-Home or Self Care       Diet: Regular diet  Discharge Activity: Ambulate in house  Discharge Instructions    Discharge patient   Complete by: As directed    Discharge disposition: 01-Home or Self Care   Discharge patient date: 12/12/2019     Allergies as of 12/12/2019   No Known Allergies     Medication List    TAKE these medications   acetaminophen 500 MG tablet Commonly known as: TYLENOL Take 500 mg by mouth every 6 (six) hours as needed.   cyclobenzaprine 10 MG tablet Commonly known as: FLEXERIL Take 1 tablet (10 mg total) by mouth 3 (three) times daily as needed for muscle spasms.   doxylamine (Sleep) 25 MG tablet Commonly known as: UNISOM Take 25 mg by mouth at bedtime as needed.    NIFEdipine 10 MG capsule Commonly known as: PROCARDIA Take 1 capsule (10 mg total) by mouth every 6 (six) hours.   omeprazole 20 MG capsule Commonly known as: PRILOSEC Take 1 capsule (20 mg total) by mouth daily.   Prenatal 27-1 MG Tabs Take by mouth.        Total time spent taking care of this patient: 20 minutes  Signed: 02/11/2020 12/12/2019, 8:36 PM

## 2019-12-14 ENCOUNTER — Encounter: Payer: BC Managed Care – PPO | Admitting: Obstetrics & Gynecology

## 2019-12-17 ENCOUNTER — Telehealth: Payer: Self-pay | Admitting: Obstetrics & Gynecology

## 2019-12-17 NOTE — Telephone Encounter (Signed)
Patient is calling to follow up on her FMLA paperwork. Please advise  

## 2019-12-17 NOTE — Telephone Encounter (Signed)
Pt aware copies up front °

## 2019-12-18 ENCOUNTER — Other Ambulatory Visit: Payer: Self-pay

## 2019-12-18 ENCOUNTER — Ambulatory Visit (INDEPENDENT_AMBULATORY_CARE_PROVIDER_SITE_OTHER): Payer: BC Managed Care – PPO | Admitting: Obstetrics & Gynecology

## 2019-12-18 ENCOUNTER — Encounter: Payer: BC Managed Care – PPO | Admitting: Obstetrics & Gynecology

## 2019-12-18 ENCOUNTER — Encounter: Payer: Self-pay | Admitting: Obstetrics & Gynecology

## 2019-12-18 VITALS — BP 108/72 | Wt 185.8 lb

## 2019-12-18 DIAGNOSIS — Z3493 Encounter for supervision of normal pregnancy, unspecified, third trimester: Secondary | ICD-10-CM

## 2019-12-18 DIAGNOSIS — Z3A34 34 weeks gestation of pregnancy: Secondary | ICD-10-CM

## 2019-12-18 NOTE — Progress Notes (Signed)
  Subjective  Fetal Movement? yes Contractions? Rare, improved w Procardia Leaking Fluid? no Vaginal Bleeding? no  Objective  BP 108/72   Wt 185 lb 12.8 oz (84.3 kg)   LMP 03/06/2019   BMI 33.98 kg/m  General: NAD Pumonary: no increased work of breathing Abdomen: gravid, non-tender Extremities: no edema Psychiatric: mood appropriate, affect full  Assessment  29 y.o. G1P0 at [redacted]w[redacted]d by  01/23/2020, by Ultrasound presenting for routine prenatal visit  Plan   Problem List Items Addressed This Visit      Other   Encounter for supervision of low-risk pregnancy, antepartum    Other Visit Diagnoses    [redacted] weeks gestation of pregnancy    -  Primary   Relevant Orders   POC Urinalysis Dipstick OB      pregnancy1 Problems (from 03/06/19 to present)    Problem Noted Resolved   Encounter for supervision of low-risk pregnancy, antepartum 06/22/2019 by Nadara Mustard, MD No   Overview Addendum 12/14/2019  1:28 PM by Farrel Conners, CNM    [facility] Prenatal Labs  Dating 9 week [place] Blood type: B/Positive/-- (02/12 1537)   Genetic Screen NIPS: Normal XX, Inheritest: CF neg, Fragile-X neg, SMA neg Antibody:Negative (02/12 1537)  Anatomic [place] Complete Rubella: <0.90 (02/12 1537) Varicella: Immune  GTT Third trimester: 147 needs 3hr- passed RPR: Non Reactive (02/12 1537)   Rhogam N/A HBsAg: Negative (02/12 1537)   TDaP vaccine    11/07/2019  Flu Shot: 01/2019 HIV: Non Reactive (02/12 1537)   Baby Food                        Breast        GBS: negative  Contraception  Pap:06/22/19 NIL  CBB  No   CS/VBAC n/a   Support Person BF [name]           Previous Version     Cont procardia prn now.  Risks of PTL discussed.  PNV,[facility]  Weekly prenatal care visits  Covid19 vaccine discussed  Annamarie Major, MD, [facility], [facility] 12/18/2019  2:01 PM

## 2019-12-18 NOTE — Patient Instructions (Signed)

## 2019-12-25 ENCOUNTER — Encounter: Payer: BC Managed Care – PPO | Admitting: Obstetrics

## 2019-12-25 ENCOUNTER — Inpatient Hospital Stay
Admission: EM | Admit: 2019-12-25 | Discharge: 2019-12-27 | DRG: 806 | Disposition: A | Discharge status: Home / Self Care | Payer: BC Managed Care – PPO | Attending: Obstetrics and Gynecology | Admitting: Obstetrics and Gynecology

## 2019-12-25 ENCOUNTER — Other Ambulatory Visit: Payer: Self-pay

## 2019-12-25 ENCOUNTER — Encounter: Payer: Self-pay | Admitting: Obstetrics and Gynecology

## 2019-12-25 DIAGNOSIS — Z3A35 35 weeks gestation of pregnancy: Secondary | ICD-10-CM | POA: Diagnosis not present

## 2019-12-25 DIAGNOSIS — Z23 Encounter for immunization: Secondary | ICD-10-CM | POA: Diagnosis not present

## 2019-12-25 DIAGNOSIS — Z20822 Contact with and (suspected) exposure to covid-19: Secondary | ICD-10-CM | POA: Diagnosis present

## 2019-12-25 DIAGNOSIS — O9081 Anemia of the puerperium: Secondary | ICD-10-CM | POA: Diagnosis not present

## 2019-12-25 DIAGNOSIS — B009 Herpesviral infection, unspecified: Secondary | ICD-10-CM | POA: Diagnosis present

## 2019-12-25 DIAGNOSIS — D62 Acute posthemorrhagic anemia: Secondary | ICD-10-CM | POA: Diagnosis not present

## 2019-12-25 DIAGNOSIS — Z3490 Encounter for supervision of normal pregnancy, unspecified, unspecified trimester: Secondary | ICD-10-CM

## 2019-12-25 DIAGNOSIS — O4703 False labor before 37 completed weeks of gestation, third trimester: Secondary | ICD-10-CM | POA: Diagnosis present

## 2019-12-25 LAB — CBC
HCT: 38.4 % (ref 36.0–46.0)
Hemoglobin: 12.7 g/dL (ref 12.0–15.0)
MCH: 31.1 pg (ref 26.0–34.0)
MCHC: 33.1 g/dL (ref 30.0–36.0)
MCV: 94.1 fL (ref 80.0–100.0)
Platelets: 214 10*3/uL (ref 150–400)
RBC: 4.08 MIL/uL (ref 3.87–5.11)
RDW: 12.4 % (ref 11.5–15.5)
WBC: 13.9 10*3/uL — ABNORMAL HIGH (ref 4.0–10.5)
nRBC: 0 % (ref 0.0–0.2)

## 2019-12-25 LAB — TYPE AND SCREEN
ABO/RH(D): B POS
Antibody Screen: NEGATIVE

## 2019-12-25 LAB — ABO/RH: ABO/RH(D): B POS

## 2019-12-25 LAB — SARS CORONAVIRUS 2 BY RT PCR (HOSPITAL ORDER, PERFORMED IN ~~LOC~~ HOSPITAL LAB): SARS Coronavirus 2: NEGATIVE

## 2019-12-25 MED ORDER — ACETAMINOPHEN 325 MG PO TABS: 650.0000 mg | ORAL_TABLET | ORAL | Status: DC | PRN

## 2019-12-25 MED ORDER — OXYTOCIN BOLUS FROM INFUSION
333.0000 mL | Freq: Once | INTRAVENOUS | Status: AC
  Administered 2019-12-25: 333 mL via INTRAVENOUS

## 2019-12-25 MED ORDER — ONDANSETRON HCL 4 MG/2ML IJ SOLN: 4.0000 mg | Freq: Four times a day (QID) | INTRAMUSCULAR | Status: DC | PRN

## 2019-12-25 MED ORDER — IBUPROFEN 600 MG PO TABS
600.0000 mg | ORAL_TABLET | Freq: Four times a day (QID) | ORAL | Status: DC
  Administered 2019-12-25 – 2019-12-27 (×9): 600 mg via ORAL
  Filled 2019-12-25 (×9): qty 1

## 2019-12-25 MED ORDER — PRENATAL MULTIVITAMIN CH
1.0000 | ORAL_TABLET | Freq: Every day | ORAL | Status: DC
  Administered 2019-12-25 – 2019-12-26 (×2): 1 via ORAL
  Filled 2019-12-25 (×3): qty 1

## 2019-12-25 MED ORDER — HYDROCODONE-ACETAMINOPHEN 5-325 MG PO TABS: 1.0000 | ORAL_TABLET | Freq: Four times a day (QID) | ORAL | Status: DC | PRN

## 2019-12-25 MED ORDER — OXYTOCIN 10 UNIT/ML IJ SOLN
INTRAMUSCULAR | Status: AC
  Filled 2019-12-25: qty 2

## 2019-12-25 MED ORDER — MEASLES, MUMPS & RUBELLA VAC IJ SOLR
0.5000 mL | Freq: Once | INTRAMUSCULAR | Status: AC
  Administered 2019-12-27: 0.5 mL via SUBCUTANEOUS
  Filled 2019-12-25 (×2): qty 0.5

## 2019-12-25 MED ORDER — DIBUCAINE (PERIANAL) 1 % EX OINT
1.0000 "application " | TOPICAL_OINTMENT | CUTANEOUS | Status: DC | PRN
  Filled 2019-12-25: qty 28

## 2019-12-25 MED ORDER — ONDANSETRON HCL 4 MG PO TABS
4.0000 mg | ORAL_TABLET | ORAL | Status: DC | PRN
  Filled 2019-12-25: qty 1

## 2019-12-25 MED ORDER — COCONUT OIL OIL
1.0000 "application " | TOPICAL_OIL | Status: DC | PRN
  Administered 2019-12-25: 1 via TOPICAL
  Filled 2019-12-25: qty 120

## 2019-12-25 MED ORDER — MISOPROSTOL 200 MCG PO TABS
ORAL_TABLET | ORAL | Status: AC
  Filled 2019-12-25: qty 4

## 2019-12-25 MED ORDER — AMMONIA AROMATIC IN INHA
RESPIRATORY_TRACT | Status: AC
  Filled 2019-12-25: qty 10

## 2019-12-25 MED ORDER — FERROUS SULFATE 325 (65 FE) MG PO TABS
325.0000 mg | ORAL_TABLET | Freq: Two times a day (BID) | ORAL | Status: DC
  Administered 2019-12-25 – 2019-12-27 (×4): 325 mg via ORAL
  Filled 2019-12-25 (×4): qty 1

## 2019-12-25 MED ORDER — LACTATED RINGERS IV SOLN
500.0000 mL | INTRAVENOUS | Status: DC | PRN
  Administered 2019-12-25: 500 mL via INTRAVENOUS

## 2019-12-25 MED ORDER — OXYTOCIN-SODIUM CHLORIDE 30-0.9 UT/500ML-% IV SOLN
INTRAVENOUS | Status: AC
  Filled 2019-12-25: qty 1000

## 2019-12-25 MED ORDER — LACTATED RINGERS IV SOLN: INTRAVENOUS | Status: DC

## 2019-12-25 MED ORDER — ONDANSETRON HCL 4 MG/2ML IJ SOLN: 4.0000 mg | INTRAMUSCULAR | Status: DC | PRN

## 2019-12-25 MED ORDER — DIPHENHYDRAMINE HCL 25 MG PO CAPS: 25.0000 mg | ORAL_CAPSULE | Freq: Four times a day (QID) | ORAL | Status: DC | PRN

## 2019-12-25 MED ORDER — WITCH HAZEL-GLYCERIN EX PADS
1.0000 "application " | MEDICATED_PAD | CUTANEOUS | Status: DC | PRN
  Filled 2019-12-25 (×2): qty 100

## 2019-12-25 MED ORDER — SOD CITRATE-CITRIC ACID 500-334 MG/5ML PO SOLN: 30.0000 mL | ORAL | Status: DC | PRN

## 2019-12-25 MED ORDER — POLYETHYLENE GLYCOL 3350 17 G PO PACK
17.0000 g | PACK | Freq: Every day | ORAL | Status: DC
  Administered 2019-12-25 – 2019-12-27 (×3): 17 g via ORAL
  Filled 2019-12-25 (×3): qty 1

## 2019-12-25 MED ORDER — ACETAMINOPHEN 325 MG PO TABS
650.0000 mg | ORAL_TABLET | ORAL | Status: DC | PRN
  Administered 2019-12-25 – 2019-12-27 (×5): 650 mg via ORAL
  Filled 2019-12-25 (×5): qty 2

## 2019-12-25 MED ORDER — BENZOCAINE-MENTHOL 20-0.5 % EX AERO
1.0000 "application " | INHALATION_SPRAY | CUTANEOUS | Status: DC | PRN
  Administered 2019-12-25: 1 via TOPICAL
  Filled 2019-12-25 (×3): qty 56

## 2019-12-25 MED ORDER — SENNOSIDES-DOCUSATE SODIUM 8.6-50 MG PO TABS
2.0000 | ORAL_TABLET | ORAL | Status: DC
  Administered 2019-12-25 – 2019-12-26 (×2): 2 via ORAL
  Filled 2019-12-25: qty 2

## 2019-12-25 MED ORDER — OXYCODONE-ACETAMINOPHEN 5-325 MG PO TABS: 2.0000 | ORAL_TABLET | ORAL | Status: DC | PRN

## 2019-12-25 MED ORDER — OXYTOCIN-SODIUM CHLORIDE 30-0.9 UT/500ML-% IV SOLN: 2.5000 [IU]/h | INTRAVENOUS | Status: DC

## 2019-12-25 MED ORDER — LIDOCAINE HCL (PF) 1 % IJ SOLN: 30.0000 mL | INTRAMUSCULAR | Status: DC | PRN

## 2019-12-25 MED ORDER — SIMETHICONE 80 MG PO CHEW: 80.0000 mg | CHEWABLE_TABLET | ORAL | Status: DC | PRN

## 2019-12-25 MED ORDER — OXYCODONE-ACETAMINOPHEN 5-325 MG PO TABS: 1.0000 | ORAL_TABLET | ORAL | Status: DC | PRN

## 2019-12-25 MED ORDER — LIDOCAINE HCL (PF) 1 % IJ SOLN
INTRAMUSCULAR | Status: AC
  Filled 2019-12-25: qty 30

## 2019-12-25 NOTE — Lactation Note (Signed)
This note was copied from a baby's chart. Lactation Consultation Note  Patient Name: Carmen Keller [id number] Date: 12/25/2019 Reason for consult: Follow-up assessment;Mother's request;Late-preterm 34-36.6wks;1st time breastfeeding;Other (Comment)  LC in to assist mom with attempted feed. Pre-feed close to 2hrs ago was 46, with no successful feed following. Mom had baby in cradle position with support of breastfeeding pillow towards the left breast. LC adjusted baby's hips slightly for turned in, and attempted to assisted with latch. Baby was a little more eager than last feeding, but still not able to sustain latch. Nipple shield, size 20, given. Baby able to successfully latch with nipple shield and was on/off with sucking pattern, colostrum visible in shield and audible swallows occasionally but not consistently. LC also discussed with parents use of DEBP to assist with additional stimulation, milk removal, and use of EBM for supplement.  DEBP set-up, parents educated in use, cleaning, milk storage, and frequency. RN repeated pre-feed BS, BS below 45, recommended to give supplement of EBM via syringe and possible supplement of donor or formula for additional nutrition. Parents chose formula for this supplement. Parents also educated on finger feed and use of curved tip syringe. Baby tolerated syringe feeding of 12mL EBM, and bottle feed of 74mL formula. Feeding plan overnight: feeding at breast attempt minimum every 3hrs, or earlier pending baby's cues, use of pump every 3hrs, and supplement PRN of EBM or formula until BS stabilized. Parents acknowledge plan and are on board will feeding plan tonight.  Maternal Data Formula Feeding for Exclusion: No Has patient been taught Hand Expression?: Yes Does the patient have breastfeeding experience prior to this delivery?: No  Feeding Feeding Type: Bottle Fed - Formula Nipple Type: Slow - flow  LATCH Score Latch: Repeated attempts needed to  sustain latch, nipple held in mouth throughout feeding, stimulation needed to elicit sucking reflex.  Audible Swallowing: A few with stimulation  Type of Nipple: Everted at rest and after stimulation  Comfort (Breast/Nipple): Soft / non-tender  Hold (Positioning): Assistance needed to correctly position infant at breast and maintain latch.  LATCH Score: 7  Interventions Interventions: DEBP  Lactation Tools Discussed/Used Tools: Pump;Nipple Shields Nipple shield size: 20 Breast pump type: Double-Electric Breast Pump Pump Review: Setup, frequency, and cleaning;Milk Storage Initiated by:: Magnus Ivan, MPH, IBCLC Date initiated:: 12/25/19   Consult Status Consult Status: Follow-up Date: 12/25/19 Follow-up type: In-patient    Danford Bad 12/25/2019, 4:49 PM

## 2019-12-25 NOTE — ED Provider Notes (Signed)
[facility] Emergency Department Provider Note  ____________________________________________  Time seen: Approximately 8:47 AM  I have reviewed the triage vital signs and the nursing notes.   HISTORY  Chief Complaint contractions   HPI Carmen Keller is a 29 y.o. female G1, P1 at 36 weeks 4 days gestation, followed by [facility] who complains of severe contractions started this morning at 7 AM.  Occurring 1 minute apart.  Having fluid leakage.  No vaginal bleeding.  Has been having normal fetal movements up until this morning.  Feels pressure and an urge to push.  Brought immediately to treatment room from ED check in.   On Procardia for preterm contractions   Past Medical History:  Diagnosis Date  . Allergy   . Anxiety   . Depression      Patient Active Problem List   Diagnosis Date Noted  . Preterm contractions 12/12/2019  . Preterm labor 11/28/2019  . Preterm labor in third trimester 11/28/2019  . [redacted] weeks gestation of pregnancy   . Threatened premature labor in third trimester   . Labor and delivery, indication for care 11/26/2019  . Encounter for supervision of low-risk pregnancy, antepartum 06/22/2019  . Herpes simplex 11/23/2018     Past Surgical History:  Procedure Laterality Date  . KNEE SURGERY       Prior to Admission medications   Medication Sig Start Date End Date Taking? Authorizing Provider  acetaminophen (TYLENOL) 500 MG tablet Take 500 mg by mouth every 6 (six) hours as needed.    [provider]  cyclobenzaprine (FLEXERIL) 10 MG tablet Take 1 tablet (10 mg total) by mouth 3 (three) times daily as needed for muscle spasms. 11/21/19   Mirna Mires, CNM  doxylamine, Sleep, (UNISOM) 25 MG tablet Take 25 mg by mouth at bedtime as needed.     [provider]  NIFEdipine (PROCARDIA) 10 MG capsule Take 1 capsule (10 mg total) by mouth every 6 (six) hours. 11/30/19   Schuman, Jaquelyn Bitter, MD   omeprazole (PRILOSEC) 20 MG capsule Take 1 capsule (20 mg total) by mouth daily. 10/29/19   Tresea Mall, CNM  Prenatal 27-1 MG TABS Take by mouth.    [provider]     Allergies Patient has no known allergies.   Family History  Problem Relation Age of Onset  . Diabetes Father   . Cancer Maternal Grandfather   . Diabetes Paternal Grandfather     Social History Social History   Tobacco Use  . Smoking status: Never Smoker  . Smokeless tobacco: Never Used  Substance Use Topics  . Alcohol use: No  . Drug use: No    Review of Systems  Constitutional:   No fever or chills.   Cardiovascular:   No chest pain. Respiratory:   No dyspnea. Gastrointestinal: Cramping pelvic pain    ____________________________________________   PHYSICAL EXAM:  VITAL SIGNS: ED Triage Vitals  Enc Vitals Group     BP      Pulse      Resp      Temp      Temp src      SpO2      Weight      Height      Head Circumference      Peak Flow      Pain Score      Pain Loc      Pain Edu?      Excl. in GC?  Vital signs reviewed, nursing assessments reviewed.   Constitutional:   Alert and oriented. Non-toxic appearance. Eyes:   Conjunctivae are normal. EOMI. ENT      Head:   Normocephalic and atraumatic.    Gastrointestinal:   Soft and nontender.  Gravid. GU: no external lesions. Fetal head palpable approx 4 cm inside labia.  Musculoskeletal:   Normal range of motion in all extremities.  Neurologic:   Normal speech and language.  Motor grossly intact. No acute focal neurologic deficits are appreciated.   ____________________________________________    LABS (pertinent positives/negatives) (all labs ordered are listed, but only abnormal results are displayed) Labs Reviewed  CBC  RPR  TYPE AND SCREEN   ____________________________________________   EKG  ____________________________________________    RADIOLOGY  No results  found.  ____________________________________________   PROCEDURES Procedures  ____________________________________________  CLINICAL IMPRESSION / ASSESSMENT AND PLAN / ED COURSE  Pertinent labs & imaging results that were available during my care of the patient were reviewed by me and considered in my medical decision making (see chart for details).  Carmen Keller was evaluated in Emergency Department on 12/25/2019 for the symptoms described in the history of present illness. She was evaluated in the context of the global COVID-19 pandemic, which necessitated consideration that the patient might be at risk for infection with the SARS-CoV-2 virus that causes COVID-19. Institutional protocols and algorithms that pertain to the evaluation of patients at risk for COVID-19 are in a state of rapid change based on information released by regulatory bodies including the CDC and federal and state organizations. These policies and algorithms were followed during the patient's care in the ED.   Patient presents near term, in active labor with impending delivery.  There was time to transfer pt to L&D for better delivery management including any possible complications such as shoulder distocia, so pt was taken immediately to L&D Obs 4 with L&D staff waiting on arrival. I accompanied the patient throughout transport for direct handoff to L&D provider.      ____________________________________________   FINAL CLINICAL IMPRESSION(S) / ED DIAGNOSES    Final diagnoses:  Active preterm labor, single or unspecified fetus     ED Discharge Orders    None      Portions of this note were generated with dragon dictation software. Dictation errors may occur despite best attempts at proofreading.   Sharman Cheek, MD 12/25/19 [phone]

## 2019-12-25 NOTE — H&P (Signed)
Obstetric H&P   Chief Complaint: Contractions   Prenatal Care Provider: [facility]  History of Present Illness: 29 y.o. G1P0 [redacted]w[redacted]d by 01/23/2020, by Ultrasound presenting to L&D with contractions and leaking of clear fluid.  +FM, no VB.  Pregnancy notable for preterm labor s/p BMZ at 31 weeks.   Last growth 2,172g 85%ile at 33 weeks.  Pregravid weight 62.1 kg Total Weight Gain 22.1 kg  pregnancy1 Problems (from 03/06/19 to present)    Problem Noted Resolved   Encounter for supervision of low-risk pregnancy, antepartum 06/22/2019 by Nadara Mustard, MD No   Overview Addendum 12/14/2019  1:28 PM by Farrel Conners, CNM    [facility] Prenatal Labs  Dating 9 week [place] Blood type: B/Positive/-- (02/12 1537)   Genetic Screen NIPS: Normal XX, Inheritest: CF neg, Fragile-X neg, SMA neg Antibody:Negative (02/12 1537)  Anatomic [place] Complete Rubella: <0.90 (02/12 1537) Varicella: Immune  GTT Third trimester: 147 needs 3hr- passed RPR: Non Reactive (02/12 1537)   Rhogam N/A HBsAg: Negative (02/12 1537)   TDaP vaccine    11/07/2019  Flu Shot: 01/2019 HIV: Non Reactive (02/12 1537)   Baby Food                        Breast        GBS: negative  Contraception  Pap:06/22/19 NIL  CBB  No   CS/VBAC n/a   Support Person BF [name]           Previous Version       Review of Systems: 10 point review of systems negative unless otherwise noted in HPI  Past Medical History: Patient Active Problem List   Diagnosis Date Noted  . Preterm contractions 12/12/2019  . Preterm labor 11/28/2019  . Preterm labor in third trimester 11/28/2019  . [redacted] weeks gestation of pregnancy   . Threatened premature labor in third trimester   . Labor and delivery, indication for care 11/26/2019  . Encounter for supervision of low-risk pregnancy, antepartum 06/22/2019    [facility] Prenatal Labs  Dating 9 week [place] Blood type: B/Positive/-- (02/12 1537)   Genetic Screen NIPS: Normal XX, Inheritest: CF neg, Fragile-X  neg, SMA neg Antibody:Negative (02/12 1537)  Anatomic [place] Complete Rubella: <0.90 (02/12 1537) Varicella: Immune  GTT Third trimester: 147 needs 3hr- passed RPR: Non Reactive (02/12 1537)   Rhogam N/A HBsAg: Negative (02/12 1537)   TDaP vaccine    11/07/2019  Flu Shot: 01/2019 HIV: Non Reactive (02/12 1537)   Baby Food                        Breast        GBS: negative  Contraception  Pap:06/22/19 NIL  CBB  No   CS/VBAC n/a   Support Person BF [name]        . Herpes simplex 11/23/2018    Past Surgical History: Past Surgical History:  Procedure Laterality Date  . KNEE SURGERY      Past Obstetric History: # 1 - Date: None, Sex: None, Weight: None, GA: None, Delivery: None, Apgar1: None, Apgar5: None, Living: None, Birth Comments: None   Past Gynecologic History:  Family History: Family History  Problem Relation Age of Onset  . Diabetes Father   . Cancer Maternal Grandfather   . Diabetes Paternal Grandfather     Social History: Social History   Socioeconomic History  . Marital status: Married    Spouse name:  Micah Noel  . Number of children: Not on file  . Years of education: Not on file  . Highest education level: Not on file  Occupational History  . Not on file  Tobacco Use  . Smoking status: Never Smoker  . Smokeless tobacco: Never Used  Substance and Sexual Activity  . Alcohol use: No  . Drug use: No  . Sexual activity: Not Currently    Birth control/protection: None  Other Topics Concern  . Not on file  Social History Narrative  . Not on file   Social Determinants of Health   Financial Resource Strain:   . Difficulty of Paying Living Expenses:   Food Insecurity:   . Worried About Programme researcher, broadcasting/film/video in the Last Year:   . Barista in the Last Year:   Transportation Needs:   . Freight forwarder (Medical):   Marland Kitchen Lack of Transportation (Non-Medical):   Physical Activity:   . Days of Exercise per Week:   . Minutes of Exercise per Session:     Stress:   . Feeling of Stress :   Social Connections:   . Frequency of Communication with Friends and Family:   . Frequency of Social Gatherings with Friends and Family:   . Attends Religious Services:   . Active Member of Clubs or Organizations:   . Attends Banker Meetings:   Marland Kitchen Marital Status:   Intimate Partner Violence:   . Fear of Current or Ex-Partner:   . Emotionally Abused:   Marland Kitchen Physically Abused:   . Sexually Abused:     Medications: Prior to Admission medications   Medication Sig Start Date End Date Taking? Authorizing Provider  NIFEdipine (PROCARDIA) 10 MG capsule Take 1 capsule (10 mg total) by mouth every 6 (six) hours. 11/30/19  Yes Schuman, Christanna R, MD  acetaminophen (TYLENOL) 500 MG tablet Take 500 mg by mouth every 6 (six) hours as needed.    [provider]  cyclobenzaprine (FLEXERIL) 10 MG tablet Take 1 tablet (10 mg total) by mouth 3 (three) times daily as needed for muscle spasms. 11/21/19   Mirna Mires, CNM  doxylamine, Sleep, (UNISOM) 25 MG tablet Take 25 mg by mouth at bedtime as needed.     [provider]  omeprazole (PRILOSEC) 20 MG capsule Take 1 capsule (20 mg total) by mouth daily. 10/29/19   Tresea Mall, CNM  Prenatal 27-1 MG TABS Take by mouth.    [provider]    Allergies: No Known Allergies  Physical Exam: Vitals: Last menstrual period 03/06/2019.   FHT: 140, moderate, +accels, no decels non-contiguous tracing Toco: q33min  General: NAD HEENT: normocephalic, anicteric Pulmonary: No increased work of breathing Cardiovascular: RRR, distal pulses 2+ Abdomen: Gravid, non-tender Genitourinary: C/100/-2 vtx Extremities: no edema, erythema, or tenderness Neurologic: Grossly intact Psychiatric: mood appropriate, affect full  Labs: No results found for this or any previous visit (from the past 24 hour(s)).  Assessment: 29 y.o. G1P0 [redacted]w[redacted]d by 01/23/2020, by Ultrasound presenting second stage  of labor  Plan: 1) Second stage of labor - set up to start pushing.  GBS negative at 31 weeks, re-collected today  2) Fetus - cat I   3) PNL - Blood type B/Positive/-- (02/12 1537) / Anti-bodyscreen Negative (02/12 1537) / Rubella <0.90 (02/12 1537) / Varicella Immune / RPR Non Reactive (06/16 1011) / HBsAg Negative (02/12 1537) / HIV Non Reactive (06/16 1011) / 1-hr OGTT 147 with normal 3-hr / GBS NEGATIVE/-- (  07/20 0648)  4) Immunization History -  Immunization History  Administered Date(s) Administered  . Influenza-Unspecified 01/19/2019  . Tdap 11/07/2019    5) Disposition - pending delivery  Vena Austria, MD, [facility], [facility] 12/25/2019, 9:23 AM

## 2019-12-25 NOTE — Lactation Note (Signed)
This note was copied from a baby's chart. Lactation Consultation Note  Patient Name: Carmen Keller [id number]'L Date: 12/25/2019 Reason for consult: Initial assessment;1st time breastfeeding;Late-preterm 34-36.6wks;Other (Comment) (Watching BS levels)  LC in to see mom and BG Siers. Mom is G1P1, delivered SVD to [redacted]w[redacted]d baby 4hrs ago. Mom desires to breastfeed. Per protocol BS are followed, so far levels have been above minimum thresholds. LC assisted with attempted feeding. Mom has BreastFriend pillow, good placement for cross-cradle hold. LC talked parents through stimulating and waking baby into quiet alert state to help initiate breastfeeding and achieve latch. LC educated mom on hand expression and mom was able to express drops (clear) of colostrum. Baby was able to latch onto breast, and gave 1-2 strong sucks before letting ago, after 5-7 minutes of attempting, baby was asleep, soundly. LC assisted with bringing baby skin to skin on mom's chest and provided education for benefits of skin to skin for both mom and baby during the first 24 hours. Education given for newborn stomach size, feeding patterns and behaviors, early feeding cues, and output expectations. Encouraged frequent feeding attempts, breast stimulation, hand expression, and discussed the impact that this can have on establishing a plentiful milk supply for baby. Whiteboard updated with [facility] name and contact number, encouraged to call out at baby's earliest feeding cues.  Maternal Data Formula Feeding for Exclusion: No Has patient been taught Hand Expression?: Yes Does the patient have breastfeeding experience prior to this delivery?: No (G1P1)  Feeding Feeding Type: Breast Fed  LATCH Score Latch: Repeated attempts needed to sustain latch, nipple held in mouth throughout feeding, stimulation needed to elicit sucking reflex.  Audible Swallowing: None  Type of Nipple: Everted at rest and after stimulation  Comfort  (Breast/Nipple): Soft / non-tender  Hold (Positioning): Assistance needed to correctly position infant at breast and maintain latch.  LATCH Score: 6  Interventions Interventions: Breast feeding basics reviewed;Assisted with latch;Hand express;Adjust position;Support pillows  Lactation Tools Discussed/Used     Consult Status Consult Status: Follow-up Date: 12/25/19 Follow-up type: In-patient    Danford Bad 12/25/2019, 2:38 PM

## 2019-12-26 LAB — CBC
HCT: 34.1 % — ABNORMAL LOW (ref 36.0–46.0)
Hemoglobin: 11.2 g/dL — ABNORMAL LOW (ref 12.0–15.0)
MCH: 31.3 pg (ref 26.0–34.0)
MCHC: 32.8 g/dL (ref 30.0–36.0)
MCV: 95.3 fL (ref 80.0–100.0)
Platelets: 177 10*3/uL (ref 150–400)
RBC: 3.58 MIL/uL — ABNORMAL LOW (ref 3.87–5.11)
RDW: 12.7 % (ref 11.5–15.5)
WBC: 8.7 10*3/uL (ref 4.0–10.5)
nRBC: 0 % (ref 0.0–0.2)

## 2019-12-26 NOTE — Lactation Note (Signed)
This note was copied from a baby's chart. Lactation Consultation Note  Patient Name: Carmen Keller HALPF'X Date: 12/26/2019 Reason for consult: Follow-up assessment;Mother's request;1st time breastfeeding;Late-preterm 34-36.6wks  Lactation in to see mom and baby. Mom reported she was advised throughout the night to only bottle feed due to baby's trouble maintaining appropriate blood sugar levels. She has continued to pump periodically throughout the night, but states no output to give baby.  LC encouraged continued efforts in putting baby to the breast, option of adding supplement to nipple shield to help encourage a sustained latch, pumping post feeds, and dad supplementing PRN now that blood sugar levels are stabilized. Mom agrees to feeding plan, and feels comfortable and confident with all information given. Encouraged frequent attempts at the breast to help baby transition back to the breast more easily, and reviewed the benefits of frequent stimulation and attempts with milk removal in building an adequate milk supply.  Praised mom for her dedication in giving her baby breastmilk, and reassurance for available support and assistance, and normal course of lactation reviewed.  Maternal Data Formula Feeding for Exclusion: No Has patient been taught Hand Expression?: Yes Does the patient have breastfeeding experience prior to this delivery?: No  Feeding    LATCH Score                   Interventions Interventions: Breast feeding basics reviewed  Lactation Tools Discussed/Used Pump Review: Milk Storage;Setup, frequency, and cleaning Initiated by:: Magnus Ivan, MPH, IBCLC Date initiated:: 12/25/19   Consult Status Consult Status: Follow-up Date: 12/26/19    Danford Bad 12/26/2019, 3:29 PM

## 2019-12-26 NOTE — Progress Notes (Signed)
Subjective:  Doing well, minimal lochia.  No fevers or chills  Objective:  Vital signs in last 24 hours: Temp:  [97.7 F (36.5 C)-98.9 F (37.2 C)] 98.3 F (36.8 C) (08/18 0746) Pulse Rate:  [50-123] 69 (08/18 0746) Resp:  [18-20] 20 (08/18 0746) BP: (100-128)/(64-90) 111/64 (08/18 0746) SpO2:  [96 %-100 %] 97 % (08/18 0746) Weight:  [83.9 kg] 83.9 kg (08/17 0915)    General: NAD Pulmonary: no increased work of breathing Abdomen: non-distended, non-tender, fundus firm at level of umbilicus Extremities: no edema, no erythema, no tenderness  Results for orders placed or performed during the hospital encounter of 12/25/19 (from the past 72 hour(s))  SARS Coronavirus 2 by RT PCR (hospital order, performed in [facility] hospital lab) Nasopharyngeal Nasopharyngeal Swab     Status: None   Collection Time: 12/25/19  9:11 AM   Specimen: Nasopharyngeal Swab  Result Value Ref Range   SARS Coronavirus 2 NEGATIVE NEGATIVE    Comment: (NOTE) SARS-CoV-2 target nucleic acids are NOT DETECTED.  The SARS-CoV-2 RNA is generally detectable in upper and lower respiratory specimens during the acute phase of infection. The lowest concentration of SARS-CoV-2 viral copies this assay can detect is 250 copies / mL. A negative result does not preclude SARS-CoV-2 infection and should not be used as the sole basis for treatment or other patient management decisions.  A negative result may occur with improper specimen collection / handling, submission of specimen other than nasopharyngeal swab, presence of viral mutation(s) within the areas targeted by this assay, and inadequate number of viral copies (<250 copies / mL). A negative result must be combined with clinical observations, patient history, and epidemiological information.  Fact Sheet for Patients:   StrictlyIdeas.no  Fact Sheet for Healthcare Providers: BankingDealers.co.za  This test is  not yet approved or  cleared by the [place] FDA and has been authorized for detection and/or diagnosis of SARS-CoV-2 by FDA under an Emergency Use Authorization (EUA).  This EUA will remain in effect (meaning this test can be used) for the duration of the COVID-19 declaration under Section 564(b)(1) of the Act, 21 U.S.C. section 360bbb-3(b)(1), unless the authorization is terminated or revoked sooner.  Performed at [facility], [address] [place], [place] [address]   CBC     Status: Abnormal   Collection Time: 12/25/19 10:36 AM  Result Value Ref Range   WBC 13.9 (H) 4.0 - 10.5 K/uL   RBC 4.08 3.87 - 5.11 MIL/uL   Hemoglobin 12.7 12.0 - 15.0 g/dL   HCT 38.4 36 - 46 %   MCV 94.1 80.0 - 100.0 fL   MCH 31.1 26.0 - 34.0 pg   MCHC 33.1 30.0 - 36.0 g/dL   RDW 12.4 11.5 - 15.5 %   Platelets 214 150 - 400 K/uL   nRBC 0.0 0.0 - 0.2 %    Comment: Performed at [facility], [address] [place], [place] [address]  Type and screen [place]     Status: None   Collection Time: 12/25/19 10:36 AM  Result Value Ref Range   ABO/RH(D) B POS    Antibody Screen NEG    Sample Expiration      12/28/2019,2359 Performed at [facility], [address] [place], [place] [address]   ABO/Rh     Status: None   Collection Time: 12/25/19 11:34 AM  Result Value Ref Range   ABO/RH(D)      B POS Performed  at [facility], [address] [place], [place] [address]   CBC     Status: Abnormal   Collection Time: 12/26/19  6:03 AM  Result Value Ref Range   WBC 8.7 4.0 - 10.5 K/uL   RBC 3.58 (L) 3.87 - 5.11 MIL/uL   Hemoglobin 11.2 (L) 12.0 - 15.0 g/dL   HCT 34.1 (L) 36 - 46 %   MCV 95.3 80.0 - 100.0 fL   MCH 31.3 26.0 - 34.0 pg   MCHC 32.8 30.0 - 36.0 g/dL   RDW 12.7 11.5 - 15.5 %   Platelets 177 150 - 400 K/uL   nRBC 0.0 0.0 - 0.2 %    Comment: Performed at [facility], [address], [place], [place] [address]     Assessment:   29 y.o. G1P0101 postpartum day # 1 SVD at [redacted]w[redacted]d Plan:    1) Acute blood loss anemia - hemodynamically stable and asymptomatic - po ferrous sulfate  2) Blood Type --/--/B POS [facility] [address] [place]  [id number] ([phone]) / Rubella <0.90 (02/12 1537) / Varicella Immune - MMR at discharge  3) TDAP status up to date  4) Feeding plan breast & bottle  5)  Education given regarding options for contraception, as well as compatibility with breast feeding if applicable.   6) Disposition anticipate discharge [place] MD, [place] [facility] 12/26/2019, 9:13 AM

## 2019-12-26 NOTE — Discharge Summary (Signed)
Postpartum Discharge Summary  Patient Name: Carmen Keller DOB: 06-22-1990 MRN: [id number]  Date of admission: 12/25/2019 Delivery date:12/25/2019  Delivering provider: Prentice Docker D  Date of discharge: 12/27/2019  Admitting diagnosis: Preterm labor [O60.00] Preterm uterine contractions in third trimester, antepartum [O47.03] Intrauterine pregnancy: [redacted]w[redacted]d    Secondary diagnosis:  Principal Problem:   Preterm labor third trimester with preterm delivery third trimester Active Problems:   Encounter for supervision of low-risk pregnancy, antepartum   Herpes simplex   Preterm labor   Preterm uterine contractions in third trimester, antepartum  Additional problems: none    Discharge diagnosis: Preterm Pregnancy Delivered                                              Post partum procedures:none Augmentation: N/A Complications: None  Hospital course: Onset of Labor With Vaginal Delivery      29y.o. yo G1P0101 at 324w6das admitted in Active Labor on 12/25/2019. Patient had an uncomplicated labor course as follows:  Membrane Rupture Time/Date: 8:55 AM ,12/25/2019   Delivery Method:Vaginal, Spontaneous  Episiotomy: None  Lacerations:  2nd degree;Vaginal;Perineal;Labial  Patient had an uncomplicated postpartum course.  She is ambulating, tolerating a regular diet, passing flatus, and urinating well. Patient is discharged home in stable condition on 12/27/19.  Newborn Data: Birth date:12/25/2019  Birth time:9:39 AM  Gender:Female  Living status:Living  Apgars:8 ,9  Weight:3050 g   Magnesium Sulfate received: No BMZ received: Yes (not during this hospitalization, received in July)  Rhophylac:No MMR:Yes T-DaP:Given prenatally on 11/07/2019 Transfusion:No  Physical exam  Vitals:   12/26/19 0746 12/26/19 1607 12/26/19 2333 12/27/19 0849  BP: 111/64 118/80 109/63 131/78  Pulse: 69 77 72 92  Resp: '20 20 20 20  ' Temp: 98.3 F (36.8 C) 98.3 F (36.8 C) 98.2 F (36.8  C) 97.8 F (36.6 C)  TempSrc: Oral Oral Oral Oral  SpO2: 97%  97% 98%  Weight:      Height:       General: alert, cooperative and no distress Lochia: appropriate Uterine Fundus: firm Incision: N/A DVT Evaluation: No evidence of DVT seen on physical exam. Labs: Lab Results  Component Value Date   WBC 8.7 12/26/2019   HGB 11.2 (L) 12/26/2019   HCT 34.1 (L) 12/26/2019   MCV 95.3 12/26/2019   PLT 177 12/26/2019   CMP Latest Ref Rng & Units 11/06/2012  Glucose 70 - 99 mg/dL 99  BUN 6 - 23 mg/dL 10  Creatinine 0.50 - 1.10 mg/dL 0.87  Sodium 135 - 145 mEq/L 140  Potassium 3.5 - 5.3 mEq/L 4.4  Chloride 96 - 112 mEq/L 103  CO2 19 - 32 mEq/L 27  Calcium 8.4 - 10.5 mg/dL 10.3  Total Protein 6.0 - 8.3 g/dL 7.6  Total Bilirubin 0.3 - 1.2 mg/dL 0.3  Alkaline Phos 39 - 117 U/L 74  AST 0 - 37 U/L 68(H)  ALT 0 - 35 U/L 123(H)   EdFlavia Shippercore: Edinburgh Postnatal Depression Scale Screening Tool 12/27/2019  I have been able to laugh and see the funny side of things. 0  I have looked forward with enjoyment to things. 0  I have blamed myself unnecessarily when things went wrong. 0  I have been anxious or worried for no good reason. 1  I have felt scared or panicky for no good reason. 0  Things have been getting on top of me. 1  I have been so unhappy that I have had difficulty sleeping. 0  I have felt sad or miserable. 1  I have been so unhappy that I have been crying. 0  The thought of harming myself has occurred to me. 0  Edinburgh Postnatal Depression Scale Total 3      After visit meds:  Allergies as of 12/27/2019   No Known Allergies     Medication List    STOP taking these medications   cyclobenzaprine 10 MG tablet Commonly known as: FLEXERIL   doxylamine (Sleep) 25 MG tablet Commonly known as: UNISOM   NIFEdipine 10 MG capsule Commonly known as: PROCARDIA     TAKE these medications   acetaminophen 500 MG tablet Commonly known as: TYLENOL Take 500 mg by  mouth every 6 (six) hours as needed.   ibuprofen 600 MG tablet Commonly known as: ADVIL Take 1 tablet (600 mg total) by mouth every 6 (six) hours.   omeprazole 20 MG capsule Commonly known as: PRILOSEC Take 1 capsule (20 mg total) by mouth daily.   Prenatal 27-1 MG Tabs Take by mouth.        Discharge home in stable condition Infant Feeding: Breast Infant Disposition:NICU Discharge instruction: per After Visit Summary and Postpartum booklet. Activity: Advance as tolerated. Pelvic rest for 6 weeks.  Diet: routine diet Anticipated Birth Control: IUD Postpartum Appointment:2 weeks Additional Postpartum F/U: Postpartum Depression checkup Future Appointments: Future Appointments  Date Time Provider [place]  01/09/2020 10:10 AM Will Bonnet, MD [facility] None  02/04/2020  2:30 PM Will Bonnet, MD [facility] None   Follow up Visit:  Follow-up Information    Will Bonnet, MD. Go on 01/09/2020.   Specialty: Obstetrics and Gynecology Why: Follow-up appointment: Wednesday, September 1st at 10am with Dr. Glennon Mac at [facility] in [place]!  Contact information: [address] [place] [address] [phone]               SIGNED: Malachy Mood, MD, [place], [facility] 12/27/2019, 9:52 AM

## 2019-12-26 NOTE — Discharge Instructions (Signed)
Discharge Instructions:   Follow-up Appointment: Wednesday, September 1st at 10am with Dr. Jean Rosenthal at [facility] in [place]!   If there are any new medications, they have been ordered and will be available for pickup at the listed pharmacy on your way home from the hospital.   Call office if you have any of the following: headache, visual changes, fever >101.0 F, chills, shortness of breath, breast concerns, excessive vaginal bleeding, incision drainage or problems, leg pain or redness, depression or any other concerns. If you have vaginal discharge with an odor, let your doctor know.   It is normal to bleed for up to 6 weeks. You should not soak through more than 1 pad in 1 hour. If you have a blood clot larger than your fist with continued bleeding, call your doctor.   Activity: Do not lift > 10 lbs for 6 weeks (do not lift anything heavier than your baby). No intercourse, tampons, swimming pools, hot tubs, baths (only showers) for 6 weeks.  No driving for 1-2 weeks. Continue prenatal vitamin, especially if breastfeeding. Increase calories and fluids (water) while breastfeeding.   Your milk will come in, in the next couple of days (right now it is colostrum). You may have a slight fever when your milk comes in, but it should go away on its own.  If it does not, and rises above 101 F please call the doctor. You will also feel achy and your breasts will be firm. They will also start to leak. If you are breastfeeding, continue as you have been and you can pump/express milk for comfort.   If you have too much milk, your breasts can become engorged, which could lead to mastitis. This is an infection of the milk ducts. It can be very painful and you will need to notify your doctor to obtain a prescription for antibiotics. You can also treat it with a shower or hot/cold compress.   For concerns about your baby, please call your pediatrician.  For breastfeeding concerns, the lactation  consultant can be reached at [phone].   Postpartum blues (feelings of happy one minute and sad another minute) are normal for the first few weeks but if it gets worse let your doctor know.   Congratulations! We enjoyed caring for you and your new bundle of joy!

## 2019-12-26 NOTE — Lactation Note (Signed)
This note was copied from a baby's chart. Lactation Consultation Note  Patient Name: Carmen Keller [id number] Date: 12/26/2019   LC in to see mom and baby, mom and baby sleeping comfortably. LC to follow-up.   Danford Bad 12/26/2019, 10:28 AM

## 2019-12-27 LAB — RPR: RPR Ser Ql: NONREACTIVE — AB

## 2019-12-27 MED ORDER — IBUPROFEN 600 MG PO TABS: 600.0000 mg | ORAL_TABLET | Freq: Four times a day (QID) | ORAL | 0 refills | Status: DC

## 2019-12-27 NOTE — Lactation Note (Signed)
This note was copied from a baby's chart. Lactation Consultation Note  Patient Name: Carmen Keller CBSWH'Q Date: 12/27/2019 Reason for consult: Follow-up assessment;1st time breastfeeding;Primapara;Late-preterm 34-36.6wks;Difficult latch (nipple shield, pumping, syringe)  Lactation follow-up before discharge. Mom continuing breastfeeding efforts. Overnight she put baby to breast with some formula in nipple shield, reporting success at times. Mom continued to pump post breastfeeding attempt while dad gave formula supplement, parents reporting volume above 80mL for most feedings.  Mom has plan for discharge: continue putting baby to breast, pump use/giving all EBM, and formula supplement as need/directed per MD. Information given for breast and nipple care, educated on breast fullness and engorgement and management of both. Signs and symptoms of plugged ducts and mastitis provided along with when to seek care. Information for seeking outpatient lactation services and community breastfeeding resources given.  Maternal Data Formula Feeding for Exclusion: No Has patient been taught Hand Expression?: Yes Does the patient have breastfeeding experience prior to this delivery?: No  Feeding Feeding Type: Bottle Fed - Formula Nipple Type: Slow - flow  LATCH Score                   Interventions Interventions: Breast feeding basics reviewed;DEBP  Lactation Tools Discussed/Used     Consult Status Consult Status: Complete Date: 12/27/19    Danford Bad 12/27/2019, 11:31 AM

## 2020-01-03 ENCOUNTER — Other Ambulatory Visit: Payer: Self-pay | Admitting: Obstetrics and Gynecology

## 2020-01-03 DIAGNOSIS — F419 Anxiety disorder, unspecified: Secondary | ICD-10-CM

## 2020-01-03 MED ORDER — SERTRALINE HCL 50 MG PO TABS: 50.0000 mg | ORAL_TABLET | Freq: Every day | ORAL | 0 refills | Status: DC

## 2020-01-03 NOTE — Telephone Encounter (Signed)
Patient was schedule original on 01/09/20 for 2 week PP. Patient rescheduled to 01/08/20 with SDJ

## 2020-01-08 ENCOUNTER — Encounter: Payer: Self-pay | Admitting: Obstetrics and Gynecology

## 2020-01-08 ENCOUNTER — Ambulatory Visit (INDEPENDENT_AMBULATORY_CARE_PROVIDER_SITE_OTHER): Payer: BC Managed Care – PPO | Admitting: Obstetrics and Gynecology

## 2020-01-08 ENCOUNTER — Telehealth: Payer: Self-pay | Admitting: Obstetrics and Gynecology

## 2020-01-08 ENCOUNTER — Other Ambulatory Visit: Payer: Self-pay

## 2020-01-08 VITALS — BP 122/74 | Ht 62.0 in | Wt 169.0 lb

## 2020-01-08 DIAGNOSIS — F53 Postpartum depression: Secondary | ICD-10-CM

## 2020-01-08 DIAGNOSIS — O99345 Other mental disorders complicating the puerperium: Secondary | ICD-10-CM

## 2020-01-08 NOTE — Telephone Encounter (Signed)
Patient scheduled 9/27 for Liletta placement with SDJ at [facility].

## 2020-01-08 NOTE — Progress Notes (Signed)
Obstetrics & Gynecology Office Visit   Chief Complaint:  Chief Complaint  Patient presents with  . Postpartum Care  Follow up for depression  History of Present Illness: 29 y.o. G13P0101 female who is 2 weeks postpartum from a precipitous preterm vaginal delivery.  She presents today for a screening mood check due to a history of depression and anxiety. She scored a 3 on her EPDS at discharge.   She has an EPDS of 10 today. She is taking Zoloft 50 mg daily. She states that she is doing well overall, but she does get overwhelmed at times. However, at this point she is coping well. She denies SI/HI. She is breast feeding and supplementing with formula.    Past Medical History:  Diagnosis Date  . Allergy   . Anxiety   . Depression     Past Surgical History:  Procedure Laterality Date  . KNEE SURGERY      Gynecologic History: Patient's last menstrual period was 03/06/2019.  Obstetric History: G1P0101  Family History  Problem Relation Age of Onset  . Diabetes Father   . Cancer Maternal Grandfather   . Diabetes Paternal Grandfather     Social History   Socioeconomic History  . Marital status: Married    Spouse name: Micah Noel  . Number of children: Not on file  . Years of education: Not on file  . Highest education level: Not on file  Occupational History  . Not on file  Tobacco Use  . Smoking status: Never Smoker  . Smokeless tobacco: Never Used  Substance and Sexual Activity  . Alcohol use: No  . Drug use: No  . Sexual activity: Not Currently    Birth control/protection: None  Other Topics Concern  . Not on file  Social History Narrative  . Not on file   Social Determinants of Health   Financial Resource Strain:   . Difficulty of Paying Living Expenses: Not on file  Food Insecurity:   . Worried About Programme researcher, broadcasting/film/video in the Last Year: Not on file  . Ran Out of Food in the Last Year: Not on file  Transportation Needs:   . Lack of Transportation  (Medical): Not on file  . Lack of Transportation (Non-Medical): Not on file  Physical Activity:   . Days of Exercise per Week: Not on file  . Minutes of Exercise per Session: Not on file  Stress:   . Feeling of Stress : Not on file  Social Connections:   . Frequency of Communication with Friends and Family: Not on file  . Frequency of Social Gatherings with Friends and Family: Not on file  . Attends Religious Services: Not on file  . Active Member of Clubs or Organizations: Not on file  . Attends Banker Meetings: Not on file  . Marital Status: Not on file  Intimate Partner Violence:   . Fear of Current or Ex-Partner: Not on file  . Emotionally Abused: Not on file  . Physically Abused: Not on file  . Sexually Abused: Not on file    No Known Allergies  Prior to Admission medications   Medication Sig Start Date End Date Taking? Authorizing Provider  acetaminophen (TYLENOL) 500 MG tablet Take 500 mg by mouth every 6 (six) hours as needed.    [provider]  ibuprofen (ADVIL) 600 MG tablet Take 1 tablet (600 mg total) by mouth every 6 (six) hours. 12/27/19   Vena Austria, MD  omeprazole (PRILOSEC) 20 MG capsule  Take 1 capsule (20 mg total) by mouth daily. 10/29/19   Tresea Mall, CNM  Prenatal 27-1 MG TABS Take by mouth.    [provider]  sertraline (ZOLOFT) 50 MG tablet Take 1 tablet (50 mg total) by mouth daily. 01/03/20   Conard Novak, MD    Review of Systems  Constitutional: Negative.   HENT: Negative.   Eyes: Negative.   Respiratory: Negative.   Cardiovascular: Negative.   Gastrointestinal: Negative.   Genitourinary: Negative.   Musculoskeletal: Negative.   Skin: Negative.   Neurological: Negative.   Psychiatric/Behavioral: Positive for depression. Negative for hallucinations, memory loss, substance abuse and suicidal ideas. The patient is nervous/anxious. The patient does not have insomnia.      Physical Exam BP 122/74   Ht  5\' 2"  (1.575 m)   Wt 169 lb (76.7 kg)   LMP 03/06/2019   BMI 30.91 kg/m  Patient's last menstrual period was 03/06/2019. Physical Exam Constitutional:      General: She is not in acute distress.    Appearance: Normal appearance.  HENT:     Head: Normocephalic and atraumatic.  Eyes:     General: No scleral icterus.    Conjunctiva/sclera: Conjunctivae normal.  Neurological:     General: No focal deficit present.     Mental Status: She is alert and oriented to person, place, and time.     Cranial Nerves: No cranial nerve deficit.  Psychiatric:        Mood and Affect: Mood normal.        Behavior: Behavior normal.        Judgment: Judgment normal.   EPDS: 45  Female chaperone present for pelvic and breast  portions of the physical exam  Assessment: 29 y.o. G52P0101 female here for  1. Postpartum depression      Plan: Problem List Items Addressed This Visit    None    Visit Diagnoses    Postpartum depression    -  Primary    MIld at this point, coping well.  Continue to monitor. She was offered an interval appointment in 2 weeks. She declined for now. If symptoms get worse, she will increase her zoloft to 100mg  and let me know. We can schedule an appt, if needed, before her six weeks postpartum check.   G2P80, MD 01/08/2020 2:57 PM

## 2020-01-09 ENCOUNTER — Ambulatory Visit: Payer: BC Managed Care – PPO | Admitting: Obstetrics and Gynecology

## 2020-01-10 NOTE — Telephone Encounter (Signed)
Noted. Liletta reserved for this patient ?

## 2020-01-25 ENCOUNTER — Other Ambulatory Visit: Payer: Self-pay | Admitting: Obstetrics and Gynecology

## 2020-01-25 DIAGNOSIS — F419 Anxiety disorder, unspecified: Secondary | ICD-10-CM

## 2020-01-25 NOTE — Telephone Encounter (Signed)
Please advise 

## 2020-02-04 ENCOUNTER — Ambulatory Visit: Payer: BC Managed Care – PPO | Admitting: Obstetrics and Gynecology

## 2020-02-04 NOTE — Telephone Encounter (Signed)
Patient is rescheduled 01/14/20 with SDJ with [place]

## 2020-02-08 NOTE — Telephone Encounter (Signed)
Liletta reserved for 02/13/2020 for this patient [facility]).

## 2020-02-11 NOTE — Telephone Encounter (Signed)
Noted. Carmen Keller remains reserved.

## 2020-02-11 NOTE — Telephone Encounter (Signed)
Patient rescheduled to 02/12/20 with SDJ at 3:50

## 2020-02-12 ENCOUNTER — Other Ambulatory Visit (HOSPITAL_COMMUNITY)
Admission: RE | Admit: 2020-02-12 | Discharge: 2020-02-12 | Disposition: A | Discharge status: Home / Self Care | Payer: BC Managed Care – PPO | Source: Ambulatory Visit | Attending: Obstetrics and Gynecology | Admitting: Obstetrics and Gynecology

## 2020-02-12 ENCOUNTER — Ambulatory Visit (INDEPENDENT_AMBULATORY_CARE_PROVIDER_SITE_OTHER): Payer: BC Managed Care – PPO | Admitting: Obstetrics and Gynecology

## 2020-02-12 ENCOUNTER — Other Ambulatory Visit: Payer: Self-pay

## 2020-02-12 DIAGNOSIS — Z30430 Encounter for insertion of intrauterine contraceptive device: Secondary | ICD-10-CM | POA: Insufficient documentation

## 2020-02-12 DIAGNOSIS — Z113 Encounter for screening for infections with a predominantly sexual mode of transmission: Secondary | ICD-10-CM | POA: Insufficient documentation

## 2020-02-12 NOTE — Progress Notes (Signed)
Postpartum Visit  Chief Complaint:  Chief Complaint  Patient presents with  . Postpartum Care    History of Present Illness: Patient is a 29 y.o. G1P0101 presents for postpartum visit.  Date of delivery: 12/25/2019 Type of delivery: Vaginal delivery - Vacuum or forceps assisted no Episiotomy No.  Laceration: 2nd degree Pregnancy or labor problems:  Pre-term labor Any problems since the delivery:  no  Newborn Details:  SINGLETON :  1. Baby's name: Leni. Birth weight: 6.12 Maternal Details:  Breast Feeding:  YES Post partum depression/anxiety noted:  Pt has had some anxiety and depression. On Zoloft and overall tolerating well.  Date of last PAP: 06/22/2019-NORMAL  Past Medical History:  Diagnosis Date  . Allergy   . Anxiety   . Depression     Past Surgical History:  Procedure Laterality Date  . KNEE SURGERY      Prior to Admission medications   Medication Sig Start Date End Date Taking? Authorizing Provider  acetaminophen (TYLENOL) 500 MG tablet Take 500 mg by mouth every 6 (six) hours as needed.    [provider]  ibuprofen (ADVIL) 600 MG tablet Take 1 tablet (600 mg total) by mouth every 6 (six) hours. 12/27/19   Vena Austria, MD  omeprazole (PRILOSEC) 20 MG capsule Take 1 capsule (20 mg total) by mouth daily. 10/29/19   Tresea Mall, CNM  Prenatal 27-1 MG TABS Take by mouth.    [provider]  sertraline (ZOLOFT) 50 MG tablet TAKE 1 TABLET BY MOUTH EVERY DAY 01/25/20   Conard Novak, MD    No Known Allergies   Social History   Socioeconomic History  . Marital status: Married    Spouse name: Micah Noel  . Number of children: Not on file  . Years of education: Not on file  . Highest education level: Not on file  Occupational History  . Not on file  Tobacco Use  . Smoking status: Never Smoker  . Smokeless tobacco: Never Used  Substance and Sexual Activity  . Alcohol use: No  . Drug use: No  . Sexual activity: Not Currently     Birth control/protection: None  Other Topics Concern  . Not on file  Social History Narrative  . Not on file   Social Determinants of Health   Financial Resource Strain:   . Difficulty of Paying Living Expenses: Not on file  Food Insecurity:   . Worried About Programme researcher, broadcasting/film/video in the Last Year: Not on file  . Ran Out of Food in the Last Year: Not on file  Transportation Needs:   . Lack of Transportation (Medical): Not on file  . Lack of Transportation (Non-Medical): Not on file  Physical Activity:   . Days of Exercise per Week: Not on file  . Minutes of Exercise per Session: Not on file  Stress:   . Feeling of Stress : Not on file  Social Connections:   . Frequency of Communication with Friends and Family: Not on file  . Frequency of Social Gatherings with Friends and Family: Not on file  . Attends Religious Services: Not on file  . Active Member of Clubs or Organizations: Not on file  . Attends Banker Meetings: Not on file  . Marital Status: Not on file  Intimate Partner Violence:   . Fear of Current or Ex-Partner: Not on file  . Emotionally Abused: Not on file  . Physically Abused: Not on file  . Sexually Abused: Not on file  Family History  Problem Relation Age of Onset  . Diabetes Father   . Cancer Maternal Grandfather   . Diabetes Paternal Grandfather     Review of Systems  Constitutional: Negative.   HENT: Negative.   Eyes: Negative.   Respiratory: Negative.   Cardiovascular: Negative.   Gastrointestinal: Negative.   Genitourinary: Negative.   Musculoskeletal: Negative.   Skin: Negative.   Neurological: Negative.   Psychiatric/Behavioral: Negative.      Physical Exam BP 122/74   Ht 5\' 2"  (1.575 m)   Wt 167 lb (75.8 kg)   LMP 03/06/2019   BMI 30.54 kg/m   Physical Exam Constitutional:      General: She is not in acute distress.    Appearance: Normal appearance. She is well-developed.  Genitourinary:     Pelvic exam was  performed with patient in the lithotomy position.     Vulva, inguinal canal, urethra, bladder, vagina, uterus, right adnexa and left adnexa normal.     No posterior fourchette tenderness, injury or lesion present.     No cervical friability, lesion, bleeding or polyp.  HENT:     Head: Normocephalic and atraumatic.  Eyes:     General: No scleral icterus.    Conjunctiva/sclera: Conjunctivae normal.  Cardiovascular:     Rate and Rhythm: Normal rate and regular rhythm.     Heart sounds: No murmur heard.  No friction rub. No gallop.   Pulmonary:     Effort: Pulmonary effort is normal. No respiratory distress.     Breath sounds: Normal breath sounds. No wheezing or rales.  Abdominal:     General: Bowel sounds are normal. There is no distension.     Palpations: Abdomen is soft. There is no mass.     Tenderness: There is no abdominal tenderness. There is no guarding or rebound.  Musculoskeletal:        General: Normal range of motion.     Cervical back: Normal range of motion and neck supple.  Neurological:     General: No focal deficit present.     Mental Status: She is alert and oriented to person, place, and time.     Cranial Nerves: No cranial nerve deficit.  Skin:    General: Skin is warm and dry.     Findings: No erythema.  Psychiatric:        Mood and Affect: Mood normal.        Behavior: Behavior normal.        Judgment: Judgment normal.   IUD Insertion Procedure Note 03/08/2019) Patient identified, informed consent performed, consent signed.   Discussed risks of irregular bleeding, cramping, infection, malpositioning, expulsion or uterine perforation of the IUD (1:1000 placements)  which may require further procedure such as laparoscopy.  IUD while effective at preventing pregnancy do not prevent transmission of sexually transmitted diseases and use of barrier methods for this purpose was discussed. Time out was performed.  Urine pregnancy test negative.  Speculum placed in the  vagina.  Cervix visualized.  Cleaned with Betadine x 2.  Grasped anteriorly with a single tooth tenaculum.  Uterus sounded to 8 cm. IUD placed per manufacturer's recommendations.  Strings trimmed to 3 cm. Tenaculum was removed, good hemostasis noted.  Patient tolerated procedure well.   Patient was given post-procedure instructions.  She was advised to have backup contraception for one week.  Patient was also asked to check IUD strings periodically and follow up in 4 weeks for IUD check.    Female  Chaperone present during breast and/or pelvic exam.  Assessment: 29 y.o. G1P0101 presenting for 6 week postpartum visit  Plan: Problem List Items Addressed This Visit    None    Visit Diagnoses    Postpartum care and examination    -  Primary   Relevant Medications   levonorgestrel (LILETTA, 52 MG,) 19.5 MCG/DAY IUD IUD   Other Relevant Orders   Cervicovaginal ancillary only (Completed)   Encounter for IUD insertion       Relevant Medications   levonorgestrel (LILETTA, 52 MG,) 19.5 MCG/DAY IUD IUD   Other Relevant Orders   Cervicovaginal ancillary only (Completed)   Screen for STD (sexually transmitted disease)       Relevant Orders   Cervicovaginal ancillary only (Completed)       1) Contraception Education given regarding options for contraception, including IUD placement.  2)  Pap - ASCCP guidelines and rational discussed.  Patient opts for routine screening interval  3) Patient underwent screening for postpartum depression with some concerns noted. Continue to monitor  Return in about 4 weeks (around 03/11/2020) for IUD String Check.   Thomasene Mohair, MD 02/12/2020 3:47 PM

## 2020-02-13 ENCOUNTER — Ambulatory Visit: Payer: BC Managed Care – PPO | Admitting: Obstetrics and Gynecology

## 2020-02-15 LAB — CERVICOVAGINAL ANCILLARY ONLY
Chlamydia: NEGATIVE
Comment: NEGATIVE
Comment: NORMAL
Neisseria Gonorrhea: NEGATIVE

## 2020-02-16 ENCOUNTER — Encounter: Payer: Self-pay | Admitting: Obstetrics and Gynecology

## 2020-02-16 MED ORDER — LILETTA (52 MG) 19.5 MCG/DAY IU IUD: 1.0000 | INTRAUTERINE_SYSTEM | Freq: Once | INTRAUTERINE | 0 refills | Status: AC

## 2020-02-19 ENCOUNTER — Other Ambulatory Visit: Payer: Self-pay | Admitting: Obstetrics and Gynecology

## 2020-03-12 ENCOUNTER — Other Ambulatory Visit: Payer: Self-pay

## 2020-03-12 ENCOUNTER — Ambulatory Visit (INDEPENDENT_AMBULATORY_CARE_PROVIDER_SITE_OTHER): Payer: BC Managed Care – PPO | Admitting: Obstetrics and Gynecology

## 2020-03-12 ENCOUNTER — Encounter: Payer: Self-pay | Admitting: Obstetrics and Gynecology

## 2020-03-12 VITALS — BP 119/78 | Ht 62.0 in | Wt 171.0 lb

## 2020-03-12 DIAGNOSIS — Z23 Encounter for immunization: Secondary | ICD-10-CM | POA: Diagnosis not present

## 2020-03-12 DIAGNOSIS — Z30431 Encounter for routine checking of intrauterine contraceptive device: Secondary | ICD-10-CM

## 2020-03-12 NOTE — Progress Notes (Signed)
IUD String Check  Subjctive: [name] presents for IUD string check.  She had a Liletta placed 4 weeks ago.  Since placement of her IUD she had some vaginal bleeding.  She denies cramping or discomfort.  She has had intercourse since placement.  There was some discomfort. She has checked the strings.  She denies any fever, chills, nausea, vomiting, or other complaints.  She is doing much better from a mood standpoint.   Objective: BP 119/78   Ht 5\' 2"  (1.575 m)   Wt 171 lb (77.6 kg)   BMI 31.28 kg/m  Physical Exam Constitutional:      General: She is not in acute distress.    Appearance: Normal appearance. She is well-developed.  Genitourinary:     Pelvic exam was performed with patient in the lithotomy position.     Vulva, inguinal canal, urethra, bladder, vagina, uterus, right adnexa and left adnexa normal.     No posterior fourchette tenderness, injury or lesion present.     No cervical friability, lesion, bleeding or polyp.     IUD strings visualized.  HENT:     Head: Normocephalic and atraumatic.  Eyes:     General: No scleral icterus.    Conjunctiva/sclera: Conjunctivae normal.  Cardiovascular:     Rate and Rhythm: Normal rate and regular rhythm.     Heart sounds: No murmur heard.  No friction rub. No gallop.   Pulmonary:     Effort: Pulmonary effort is normal. No respiratory distress.     Breath sounds: Normal breath sounds. No wheezing or rales.  Abdominal:     General: Bowel sounds are normal. There is no distension.     Palpations: Abdomen is soft. There is no mass.     Tenderness: There is no abdominal tenderness. There is no guarding or rebound.  Musculoskeletal:        General: Normal range of motion.     Cervical back: Normal range of motion and neck supple.  Neurological:     General: No focal deficit present.     Mental Status: She is alert and oriented to person, place, and time.     Cranial Nerves: No cranial nerve deficit.   Skin:    General: Skin is warm and dry.     Findings: No erythema.  Psychiatric:        Mood and Affect: Mood normal.        Behavior: Behavior normal.        Judgment: Judgment normal.    Female chaperone was present for the entirety of the pelvic exam  Assessment: 29 y.o. year old female status post prior Liletta IUD placement 4 week ago, doing well.  Plan: 1.  The patient was given instructions to check her IUD strings monthly and call with any problems or concerns.  She should call for fevers, chills, abnormal vaginal discharge, pelvic pain, or other complaints. 2.  She will return for a annual exam in 1 year.  All questions answered.  20 minutes spent in face to face discussion with > 50% spent in counseling, management, and coordination of care for her newly-placed IUD.  Risks and benefits of IUD discussed including the risks of irregular bleeding, cramping, infection, malpositioning, expulsion, which may require further procedures such as laparoscopy.  IUDs while effective at preventing pregnancy do not prevent transmission of sexually transmitted diseases and use of barrier methods for this purpose was discussed.  Low overall incidence of failure with  99.7% efficacy rate in typical use.    Thomasene Mohair, MD 03/12/2020 4:54 PM

## 2020-03-20 ENCOUNTER — Telehealth: Payer: Self-pay

## 2020-03-20 NOTE — Telephone Encounter (Signed)
Carmen Keller w/[facility] calling to f/u on breast pump rx. They have been faxing to [phone]. Please verify if received and notify of status. VP#[phone]

## 2020-03-21 NOTE — Telephone Encounter (Signed)
[facility] can you help me with this? im about to go in a room with Jean Rosenthal. I faxed this to them after he signed it. Please verify if they have gotten it , if not have them re fax and ill get refaxed.

## 2020-03-25 NOTE — Telephone Encounter (Signed)
I will look out for form but I have not seen it come through yet for me to sign

## 2020-04-01 NOTE — Telephone Encounter (Signed)
Called Carmen Keller, she was unable to talk, spoke with Duwayne Heck and she will refax pump Rx to your attention.

## 2020-04-01 NOTE — Telephone Encounter (Signed)
I still have not seen one comet o my desk for this pt. I have 2 here for Erskine Squibb and they arent hers, can you possibly help me and call them back and have them put it attention to me please? I will get Jean Rosenthal to sign it since Erskine Squibb not in this office until next week.

## 2020-04-01 NOTE — Telephone Encounter (Signed)
Carmen Keller following up again on breast pump fax that was sent 03/17/20.

## 2020-04-07 NOTE — Telephone Encounter (Signed)
Signed and faxed

## 2020-07-30 ENCOUNTER — Other Ambulatory Visit: Payer: Self-pay | Admitting: Obstetrics and Gynecology

## 2020-07-30 DIAGNOSIS — F419 Anxiety disorder, unspecified: Secondary | ICD-10-CM

## 2020-09-07 DIAGNOSIS — J019 Acute sinusitis, unspecified: Secondary | ICD-10-CM | POA: Diagnosis not present

## 2021-02-07 DIAGNOSIS — Z20822 Contact with and (suspected) exposure to covid-19: Secondary | ICD-10-CM | POA: Diagnosis not present

## 2021-02-07 DIAGNOSIS — J208 Acute bronchitis due to other specified organisms: Secondary | ICD-10-CM | POA: Diagnosis not present

## 2021-02-07 DIAGNOSIS — R0602 Shortness of breath: Secondary | ICD-10-CM | POA: Diagnosis not present

## 2021-02-07 DIAGNOSIS — R051 Acute cough: Secondary | ICD-10-CM | POA: Diagnosis not present

## 2021-02-07 DIAGNOSIS — R918 Other nonspecific abnormal finding of lung field: Secondary | ICD-10-CM | POA: Diagnosis not present

## 2021-02-07 DIAGNOSIS — R0789 Other chest pain: Secondary | ICD-10-CM | POA: Diagnosis not present

## 2021-03-04 NOTE — Telephone Encounter (Signed)
Liletta rcvd/charged 02/12/2020

## 2021-03-16 DIAGNOSIS — F411 Generalized anxiety disorder: Secondary | ICD-10-CM | POA: Diagnosis not present

## 2021-03-16 DIAGNOSIS — F329 Major depressive disorder, single episode, unspecified: Secondary | ICD-10-CM | POA: Diagnosis not present

## 2021-04-13 DIAGNOSIS — F329 Major depressive disorder, single episode, unspecified: Secondary | ICD-10-CM | POA: Diagnosis not present

## 2021-04-13 DIAGNOSIS — F411 Generalized anxiety disorder: Secondary | ICD-10-CM | POA: Diagnosis not present

## 2021-05-15 DIAGNOSIS — J0120 Acute ethmoidal sinusitis, unspecified: Secondary | ICD-10-CM | POA: Diagnosis not present

## 2021-05-25 DIAGNOSIS — F329 Major depressive disorder, single episode, unspecified: Secondary | ICD-10-CM | POA: Diagnosis not present

## 2021-05-25 DIAGNOSIS — F411 Generalized anxiety disorder: Secondary | ICD-10-CM | POA: Diagnosis not present

## 2021-07-24 DIAGNOSIS — Z0000 Encounter for general adult medical examination without abnormal findings: Secondary | ICD-10-CM | POA: Diagnosis not present

## 2021-07-24 DIAGNOSIS — R5383 Other fatigue: Secondary | ICD-10-CM | POA: Diagnosis not present

## 2021-07-24 DIAGNOSIS — Z13220 Encounter for screening for lipoid disorders: Secondary | ICD-10-CM | POA: Diagnosis not present

## 2021-08-08 DIAGNOSIS — J020 Streptococcal pharyngitis: Secondary | ICD-10-CM | POA: Diagnosis not present

## 2021-08-10 DIAGNOSIS — J029 Acute pharyngitis, unspecified: Secondary | ICD-10-CM | POA: Diagnosis not present

## 2021-08-10 DIAGNOSIS — J020 Streptococcal pharyngitis: Secondary | ICD-10-CM | POA: Diagnosis not present

## 2021-09-11 DIAGNOSIS — M722 Plantar fascial fibromatosis: Secondary | ICD-10-CM | POA: Diagnosis not present

## 2021-09-14 DIAGNOSIS — J020 Streptococcal pharyngitis: Secondary | ICD-10-CM | POA: Diagnosis not present

## 2021-09-14 DIAGNOSIS — J029 Acute pharyngitis, unspecified: Secondary | ICD-10-CM | POA: Diagnosis not present

## 2022-06-20 IMAGING — US US OB COMP +14 WK
1 series · 13 of 28 positions shown · non-contrast
Comparison: none

CLINICAL DATA: Pre term labor.

EXAM:
OBSTETRICAL ULTRASOUND >14 WKS

[Series 1: us ob comp +14 wk · 0.26mm/px · 13 of 79 slices shown]
[im 3/79]
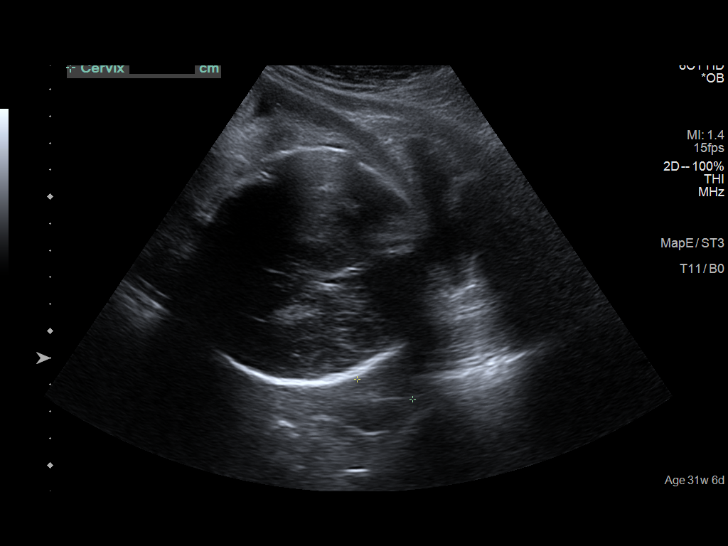
[im 9/79]
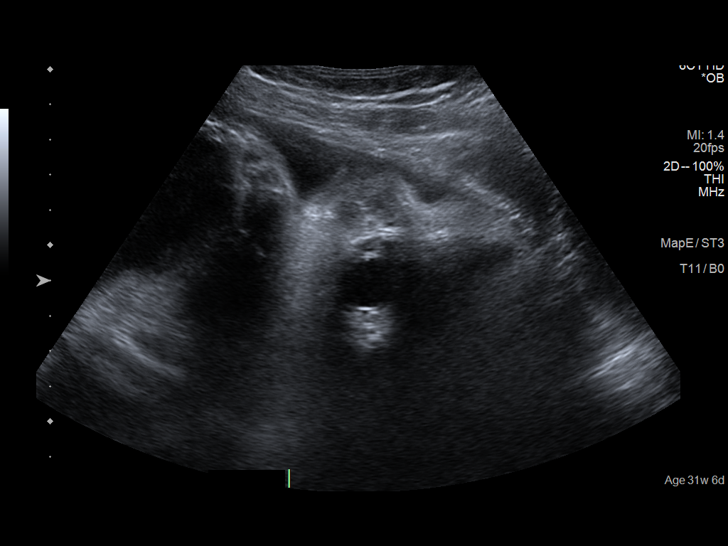
[im 15/79]
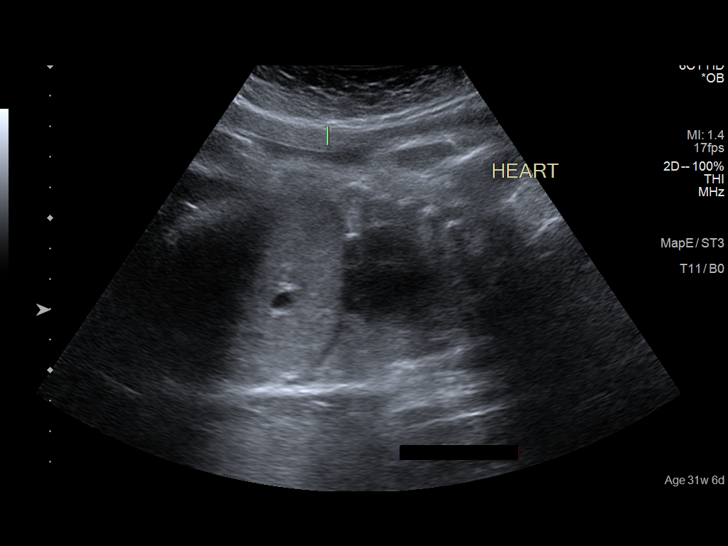
[im 21/79]
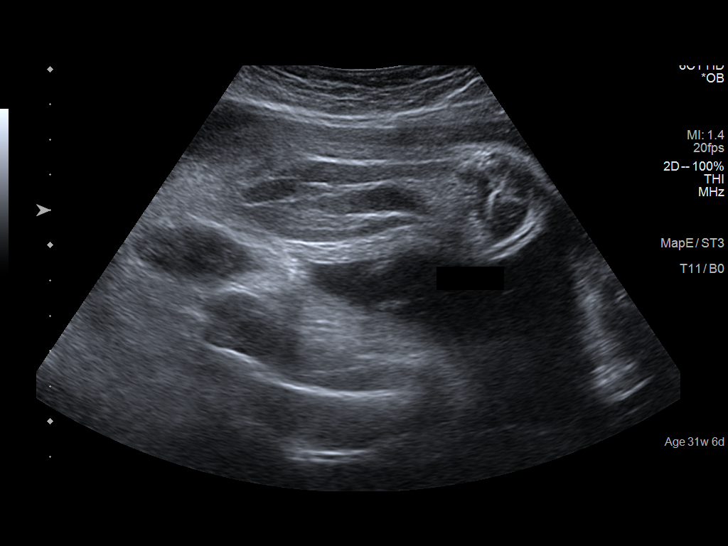
[im 27/79]
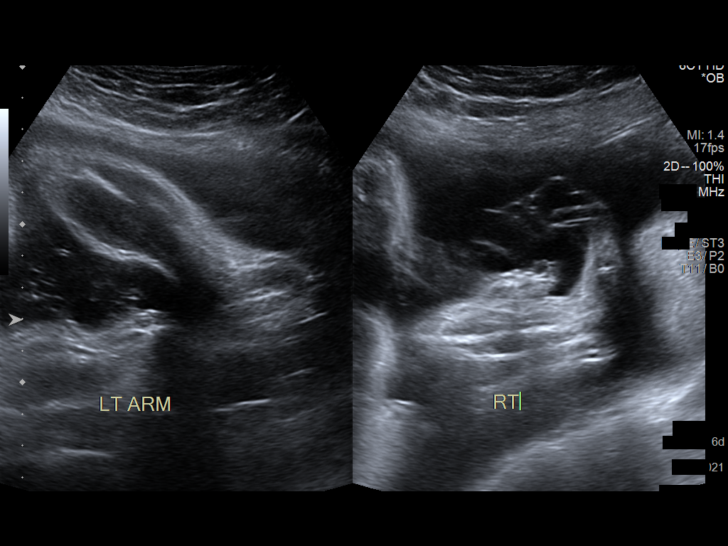
[im 32/79]
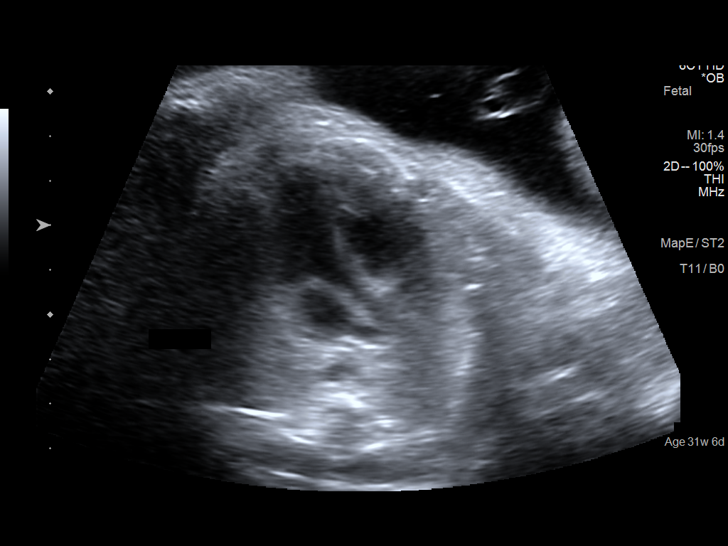
[im 41/79]
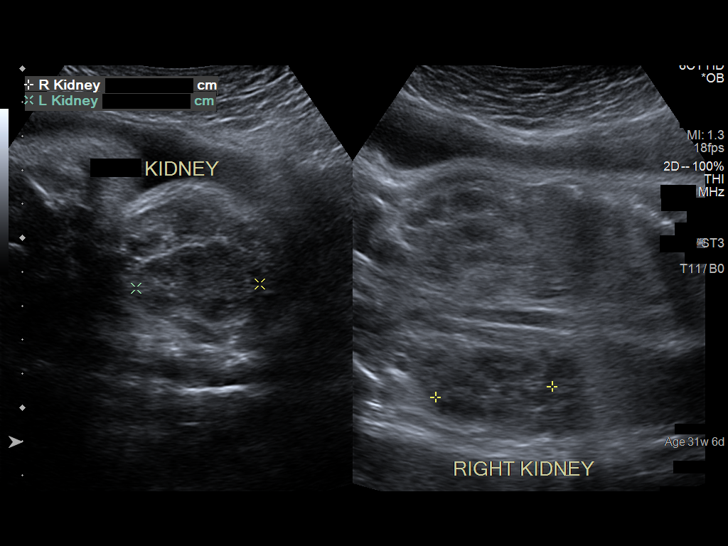
[im 47/79]
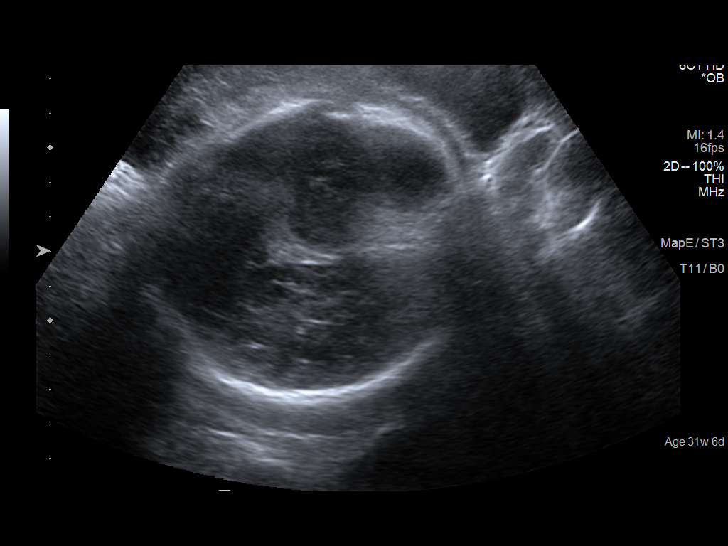
[im 53/79]
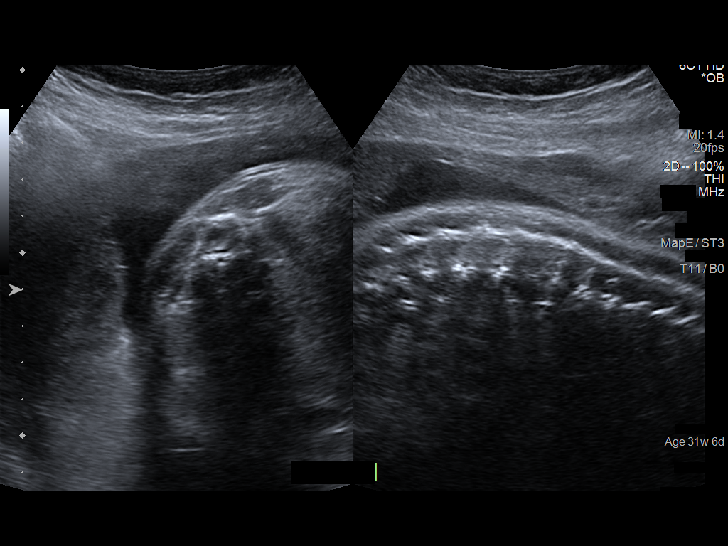
[im 58/79]
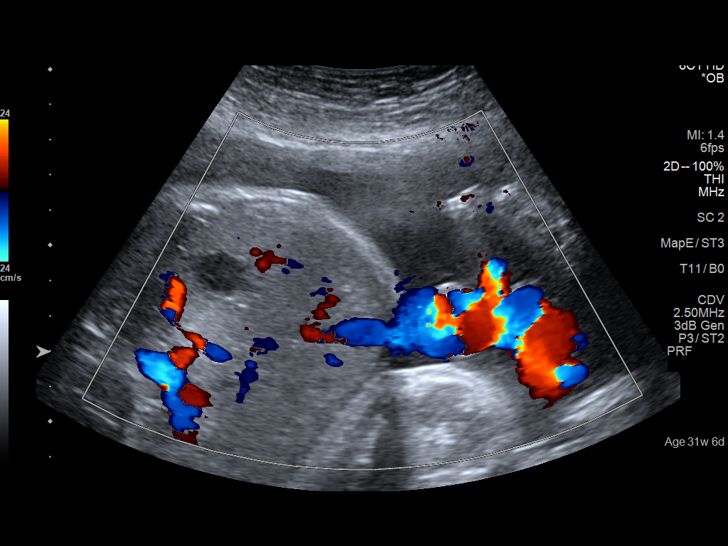
[im 64/79]
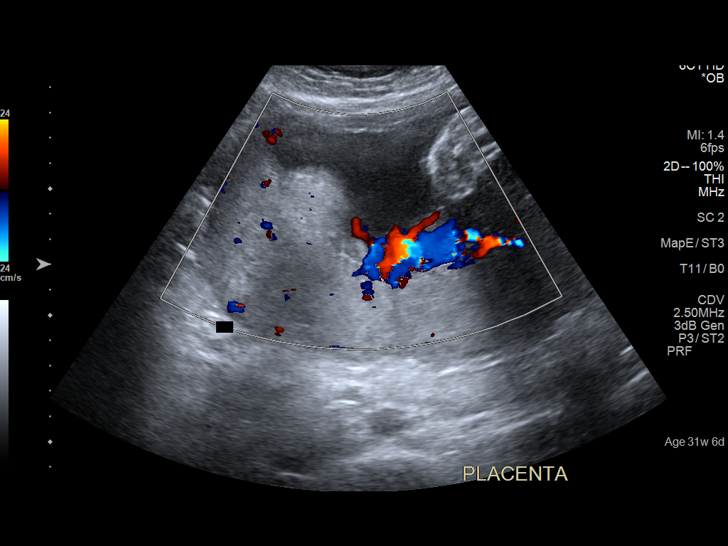
[im 70/79]
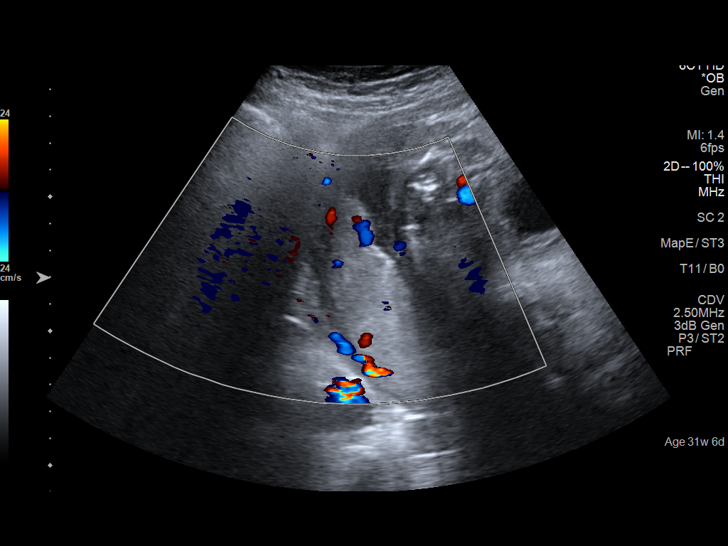
[im 76/79]
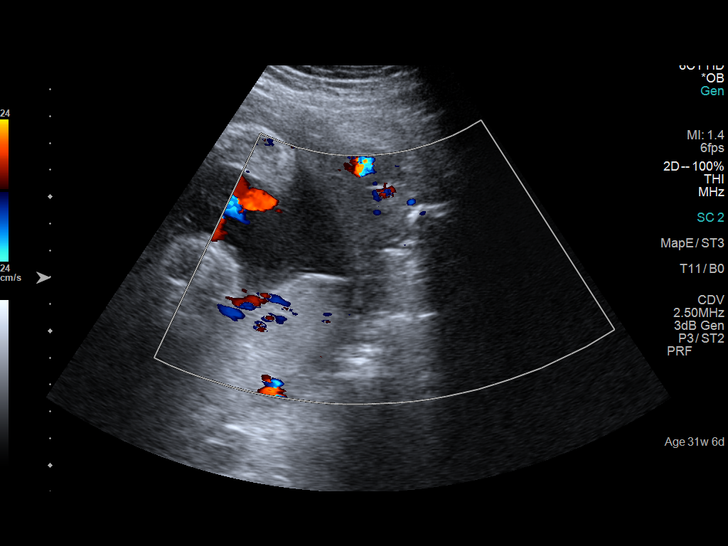

[13 of 28 positions shown; findings below may reference images not displayed]

FINDINGS: Number of Fetuses: 1

Heart Rate:  141 bpm

Movement: Present

Presentation: Cephalic

Previa: No

Placental Location: Posterior fundal

Amniotic Fluid (Subjective): Decreased

Amniotic Fluid (Objective):

AFI = 9.3 cm (5%ile= 8.6 cm, 95%= 24.2 cm for 32 wks)

FETAL BIOMETRY

BPD: 8.6cm 34w 3d

HC:   30.8cm 34w 2d

AC:   29.9cm 33w 6d

FL:   6.1cm 31w 4d

Current Mean GA: 33w 3d US EDC: 01/12/2020

Assigned GA:  31w 6d Assigned EDC: 01/23/2020

Estimated Fetal Weight:  2,172g 85%ile

FETAL ANATOMY

Lateral Ventricles: Appears normal

Thalami/CSP: Appears normal

Posterior Fossa:  Not visualized

Nuchal Region: Not visualized

Upper Lip: Appears normal

Spine: Appears normal

4 Chamber Heart on Left: Appears normal

LVOT: Appears normal

RVOT: Appears normal

Stomach on Left: Appears normal

3 Vessel Cord: Appears normal

Cord Insertion site: Not visualized

Kidneys: Appears normal

Bladder: Appears normal

Extremities: Appears normal

Technically difficult due to: Fetal position

Maternal Findings:

Cervix:  2.2 cm and closed.
IMPRESSION: 1. Single viable intrauterine pregnancy at 33 weeks 3 days. Cephalic
presentation. Amniotic fluid volume appears decreased subjectively.
AFI 9.3 cm.

2.  Maternal cervix 2.2 cm and closed.

3. Fetal posterior fossa, nuchal region, and cord insertion site not
visualized due to fetal position. Follow-up exam can be obtained.

## 2022-10-29 ENCOUNTER — Emergency Department
Admission: EM | Admit: 2022-10-29 | Discharge: 2022-10-29 | Disposition: A | Discharge status: Home / Self Care | Payer: BC Managed Care – PPO | Attending: Emergency Medicine | Admitting: Emergency Medicine

## 2022-10-29 ENCOUNTER — Other Ambulatory Visit: Payer: Self-pay

## 2022-10-29 ENCOUNTER — Encounter: Payer: Self-pay | Admitting: Emergency Medicine

## 2022-10-29 DIAGNOSIS — R519 Headache, unspecified: Secondary | ICD-10-CM | POA: Diagnosis present

## 2022-10-29 DIAGNOSIS — Z1152 Encounter for screening for COVID-19: Secondary | ICD-10-CM | POA: Diagnosis not present

## 2022-10-29 DIAGNOSIS — B349 Viral infection, unspecified: Secondary | ICD-10-CM | POA: Diagnosis not present

## 2022-10-29 LAB — CBC WITH DIFFERENTIAL/PLATELET
Abs Immature Granulocytes: 0.01 10*3/uL (ref 0.00–0.07)
Basophils Absolute: 0 10*3/uL (ref 0.0–0.1)
Basophils Relative: 1 %
Eosinophils Absolute: 0 10*3/uL (ref 0.0–0.5)
Eosinophils Relative: 0 %
HCT: 43.3 % (ref 36.0–46.0)
Hemoglobin: 14 g/dL (ref 12.0–15.0)
Immature Granulocytes: 0 %
Lymphocytes Relative: 25 %
Lymphs Abs: 1.5 10*3/uL (ref 0.7–4.0)
MCH: 30.6 pg (ref 26.0–34.0)
MCHC: 32.3 g/dL (ref 30.0–36.0)
MCV: 94.7 fL (ref 80.0–100.0)
Monocytes Absolute: 0.3 10*3/uL (ref 0.1–1.0)
Monocytes Relative: 5 %
Neutro Abs: 3.9 10*3/uL (ref 1.7–7.7)
Neutrophils Relative %: 69 %
Platelets: 218 10*3/uL (ref 150–400)
RBC: 4.57 MIL/uL (ref 3.87–5.11)
RDW: 13.1 % (ref 11.5–15.5)
WBC: 5.7 10*3/uL (ref 4.0–10.5)
nRBC: 0 % (ref 0.0–0.2)

## 2022-10-29 LAB — COMPREHENSIVE METABOLIC PANEL
ALT: 15 U/L (ref 0–44)
AST: 22 U/L (ref 15–41)
Albumin: 4.5 g/dL (ref 3.5–5.0)
Alkaline Phosphatase: 50 U/L (ref 38–126)
Anion gap: 9 (ref 5–15)
BUN: 10 mg/dL (ref 6–20)
CO2: 20 mmol/L — ABNORMAL LOW (ref 22–32)
Calcium: 8.6 mg/dL — ABNORMAL LOW (ref 8.9–10.3)
Chloride: 105 mmol/L (ref 98–111)
Creatinine, Ser: 0.79 mg/dL (ref 0.44–1.00)
GFR, Estimated: >60 mL/min (ref >60)
Glucose, Bld: 112 mg/dL — ABNORMAL HIGH (ref 70–99)
Potassium: 3.7 mmol/L (ref 3.5–5.1)
Sodium: 134 mmol/L — ABNORMAL LOW (ref 135–145)
Total Bilirubin: 0.7 mg/dL (ref 0.3–1.2)
Total Protein: 7.6 g/dL (ref 6.5–8.1)

## 2022-10-29 LAB — URINALYSIS, ROUTINE W REFLEX MICROSCOPIC
Bilirubin Urine: NEGATIVE
Glucose, UA: NEGATIVE mg/dL
Hgb urine dipstick: NEGATIVE
Ketones, ur: NEGATIVE mg/dL
Leukocytes,Ua: NEGATIVE
Nitrite: NEGATIVE
Protein, ur: NEGATIVE mg/dL
Specific Gravity, Urine: 1.008 (ref 1.005–1.030)
pH: 6 (ref 5.0–8.0)

## 2022-10-29 LAB — RESP PANEL BY RT-PCR (RSV, FLU A&B, COVID)  RVPGX2
Influenza A by PCR: NEGATIVE
Influenza B by PCR: NEGATIVE
Resp Syncytial Virus by PCR: NEGATIVE
SARS Coronavirus 2 by RT PCR: NEGATIVE

## 2022-10-29 LAB — GROUP A STREP BY PCR: Group A Strep by PCR: NOT DETECTED

## 2022-10-29 LAB — POC URINE PREG, ED: Preg Test, Ur: NEGATIVE

## 2022-10-29 MED ORDER — NAPROXEN 500 MG PO TABS: 500.0000 mg | ORAL_TABLET | Freq: Two times a day (BID) | ORAL | 0 refills | Status: AC

## 2022-10-29 MED ORDER — BUTALBITAL-APAP-CAFFEINE 50-325-40 MG PO TABS: 1.0000 | ORAL_TABLET | Freq: Four times a day (QID) | ORAL | 0 refills | Status: AC | PRN

## 2022-10-29 MED ORDER — SODIUM CHLORIDE 0.9 % IV BOLUS
1000.0000 mL | Freq: Once | INTRAVENOUS | Status: AC
  Administered 2022-10-29: 1000 mL via INTRAVENOUS

## 2022-10-29 MED ORDER — BUTALBITAL-APAP-CAFFEINE 50-325-40 MG PO TABS
1.0000 | ORAL_TABLET | Freq: Once | ORAL | Status: AC
  Administered 2022-10-29: 1 via ORAL
  Filled 2022-10-29: qty 1

## 2022-10-29 MED ORDER — KETOROLAC TROMETHAMINE 15 MG/ML IJ SOLN
15.0000 mg | Freq: Once | INTRAMUSCULAR | Status: AC
  Administered 2022-10-29: 15 mg via INTRAVENOUS
  Filled 2022-10-29: qty 1

## 2022-10-29 NOTE — ED Notes (Signed)
See triage note  Presents with h/a with some body aches and n/v  Unsure of fever  Is currently afebrile

## 2022-10-29 NOTE — Discharge Instructions (Signed)
Please follow-up with the primary care provider of your choice.  Return to the emergency department if your symptoms change or worsen and you are unable to get an appointment.

## 2022-10-29 NOTE — ED Provider Notes (Signed)
[facility] Provider Note    Event Date/Time   First MD Initiated Contact with Patient 10/29/22 [phone]     (approximate)   History   Headache and Generalized Body Aches   HPI  Carmen Keller is a 32 y.o. female with history of anxiety, depression, and as listed in EMR presents to the emergency department for treatment and evaluation of nausea, vomiting, headache, and bodyaches.  Symptoms started yesterday. No known fever, but she did wake up in a sweat and felt chilled.     Physical Exam   Triage Vital Signs: ED Triage Vitals  Enc Vitals Group     BP 10/29/22 0833 103/66     Pulse Rate 10/29/22 0833 98     Resp 10/29/22 0833 20     Temp 10/29/22 0833 98.7 F (37.1 C)     Temp Source 10/29/22 0833 Oral     SpO2 10/29/22 0833 100 %     Weight 10/29/22 0831 134 lb (60.8 kg)     Height 10/29/22 0831 5\' 2"  (1.575 m)     Head Circumference --      Peak Flow --      Pain Score 10/29/22 0831 7     Pain Loc --      Pain Edu? --      Excl. in GC? --     Most recent vital signs: Vitals:   10/29/22 0833  BP: 103/66  Pulse: 98  Resp: 20  Temp: 98.7 F (37.1 C)  SpO2: 100%    General: Awake, no distress.  CV:  Good peripheral perfusion. Heart sounds regular Resp:  Normal effort. Breath sounds clear Abd:  No distention. No tenderness Other:  Pupils equal, round, and reactive. Tonsils slightly enlarged   ED Results / Procedures / Treatments   Labs (all labs ordered are listed, but only abnormal results are displayed) Labs Reviewed  COMPREHENSIVE METABOLIC PANEL - Abnormal; Notable for the following components:      Result Value   Sodium 134 (*)    CO2 20 (*)    Glucose, Bld 112 (*)    Calcium 8.6 (*)    All other components within normal limits  URINALYSIS, ROUTINE W REFLEX MICROSCOPIC - Abnormal; Notable for the following components:   Color, Urine YELLOW (*)    APPearance CLEAR (*)    All other components within normal limits   RESP PANEL BY RT-PCR (RSV, FLU A&B, COVID)  RVPGX2  GROUP A STREP BY PCR  CBC WITH DIFFERENTIAL/PLATELET  POC URINE PREG, ED     EKG  Not indicated.   RADIOLOGY  Image and radiology report reviewed and interpreted by me. Radiology report consistent with the same.  Not indicated  PROCEDURES:  Critical Care performed: No  Procedures   MEDICATIONS ORDERED IN ED:  Medications  sodium chloride 0.9 % bolus 1,000 mL (1,000 mLs Intravenous New Bag/Given 10/29/22 1008)  ketorolac (TORADOL) 15 MG/ML injection 15 mg (15 mg Intravenous Given 10/29/22 1035)  butalbital-acetaminophen-caffeine (FIORICET) 50-325-40 MG per tablet 1 tablet (1 tablet Oral Given 10/29/22 1104)     IMPRESSION / MDM / ASSESSMENT AND PLAN / ED COURSE   I have reviewed the triage note.  Differential diagnosis includes, but is not limited to, COVID, influenza, acute viral syndrome, strep throat  Patient's presentation is most consistent with acute complicated illness / injury requiring diagnostic workup.  32 year old female presenting to the emergency department for treatment and evaluation of symptoms as  described in the HPI.  Exam is reassuring. Vital signs are normal.  Plan will be to give her some IV fluids, Toradol for her headache as long as her pregnancy test is negative, and also get a strep screen.  Labs are normal.  No COVID, influenza, or strep throat. Urinalysis is normal as well.  Results reviewed with the patient.  IV fluids are still infusing.  She states that she is feeling somewhat better but still has headache.  Plan will be to give her Fioricet.   Improvement of headache after Fioricet. She will be discharged home with prescription for the same. She will also take Naprosyn or ibuprofen. ER return precautions discussed.  FINAL CLINICAL IMPRESSION(S) / ED DIAGNOSES   Final diagnoses:  Viral syndrome  Acute intractable headache, unspecified headache type     Rx / DC Orders   ED  Discharge Orders          Ordered    butalbital-acetaminophen-caffeine (FIORICET) 50-325-40 MG tablet  Every 6 hours PRN        10/29/22 1137    naproxen (NAPROSYN) 500 MG tablet  2 times daily with meals        10/29/22 1137             Note:  This document was prepared using Dragon voice recognition software and may include unintentional dictation errors.   Chinita Pester, FNP 10/29/22 1147    Merwyn Katos, MD 10/29/22 [phone]

## 2022-10-29 NOTE — ED Triage Notes (Signed)
Presents with h/a  n/v/ and body aches  Sx's started yesterday
